# Patient Record
Sex: Female | Born: 1995 | Race: Black or African American | Hispanic: No | Marital: Single | Smoking: Never smoker
Health system: Southern US, Community
[De-identification: ages and names within clinical notes are randomized; demographics above are authoritative.]

## PROBLEM LIST (undated history)

## (undated) DIAGNOSIS — D649 Anemia, unspecified: Secondary | ICD-10-CM

## (undated) HISTORY — PX: NO PAST SURGERIES: SHX2092

---
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
## 2017-01-31 ENCOUNTER — Emergency Department: Payer: No Typology Code available for payment source

## 2017-01-31 ENCOUNTER — Emergency Department
Admission: EM | Admit: 2017-01-31 | Discharge: 2017-01-31 | Disposition: A | Discharge status: Home / Self Care | Payer: No Typology Code available for payment source | Attending: Emergency Medicine | Admitting: Emergency Medicine

## 2017-01-31 ENCOUNTER — Encounter: Payer: Self-pay | Admitting: Emergency Medicine

## 2017-01-31 ENCOUNTER — Other Ambulatory Visit: Payer: Self-pay

## 2017-01-31 DIAGNOSIS — M545 Low back pain: Secondary | ICD-10-CM | POA: Diagnosis not present

## 2017-01-31 DIAGNOSIS — R51 Headache: Secondary | ICD-10-CM | POA: Insufficient documentation

## 2017-01-31 HISTORY — DX: Anemia, unspecified: D64.9

## 2017-01-31 MED ORDER — MELOXICAM 7.5 MG PO TABS
7.5000 mg | ORAL_TABLET | Freq: Every day | ORAL | 1 refills | Status: AC
Start: 2017-01-31 — End: 2017-02-07

## 2017-01-31 MED ORDER — CYCLOBENZAPRINE HCL 5 MG PO TABS: 5.0000 mg | ORAL_TABLET | Freq: Three times a day (TID) | ORAL | 0 refills | Status: AC | PRN

## 2017-01-31 MED ORDER — KETOROLAC TROMETHAMINE 30 MG/ML IJ SOLN
30.0000 mg | Freq: Once | INTRAMUSCULAR | Status: AC
  Administered 2017-01-31: 30 mg via INTRAMUSCULAR
  Filled 2017-01-31: qty 1
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
## 2017-03-30 ENCOUNTER — Encounter: Payer: Self-pay | Admitting: Emergency Medicine

## 2017-03-30 ENCOUNTER — Emergency Department
Admission: EM | Admit: 2017-03-30 | Discharge: 2017-03-30 | Disposition: A | Discharge status: Home / Self Care | Payer: Self-pay | Attending: Emergency Medicine | Admitting: Emergency Medicine

## 2017-03-30 ENCOUNTER — Other Ambulatory Visit: Payer: Self-pay

## 2017-03-30 ENCOUNTER — Emergency Department: Payer: Self-pay

## 2017-03-30 DIAGNOSIS — F17290 Nicotine dependence, other tobacco product, uncomplicated: Secondary | ICD-10-CM | POA: Insufficient documentation

## 2017-03-30 DIAGNOSIS — R0981 Nasal congestion: Secondary | ICD-10-CM | POA: Insufficient documentation

## 2017-03-30 DIAGNOSIS — J069 Acute upper respiratory infection, unspecified: Secondary | ICD-10-CM | POA: Insufficient documentation

## 2017-03-30 DIAGNOSIS — J3489 Other specified disorders of nose and nasal sinuses: Secondary | ICD-10-CM | POA: Insufficient documentation

## 2017-03-30 DIAGNOSIS — R531 Weakness: Secondary | ICD-10-CM | POA: Insufficient documentation

## 2017-03-30 LAB — CBC WITH DIFFERENTIAL/PLATELET
BASOS ABS: 0 10*3/uL (ref 0–0.1)
BASOS PCT: 0 %
EOS ABS: 0.1 10*3/uL (ref 0–0.7)
Eosinophils Relative: 1 %
HEMATOCRIT: 43 % (ref 35.0–47.0)
Hemoglobin: 14.8 g/dL (ref 12.0–16.0)
Lymphocytes Relative: 30 %
Lymphs Abs: 1.8 10*3/uL (ref 1.0–3.6)
MCH: 32.1 pg (ref 26.0–34.0)
MCHC: 34.3 g/dL (ref 32.0–36.0)
MCV: 93.7 fL (ref 80.0–100.0)
MONO ABS: 1.1 10*3/uL — AB (ref 0.2–0.9)
Monocytes Relative: 19 %
NEUTROS ABS: 2.9 10*3/uL (ref 1.4–6.5)
Neutrophils Relative %: 50 %
Platelets: 193 10*3/uL (ref 150–440)
RBC: 4.59 MIL/uL (ref 3.80–5.20)
RDW: 13 % (ref 11.5–14.5)
WBC: 5.8 10*3/uL (ref 3.6–11.0)

## 2017-03-30 LAB — COMPREHENSIVE METABOLIC PANEL
ALT: 9 U/L — ABNORMAL LOW (ref 14–54)
AST: 21 U/L (ref 15–41)
Albumin: 4.1 g/dL (ref 3.5–5.0)
Alkaline Phosphatase: 42 U/L (ref 38–126)
Anion gap: 10 (ref 5–15)
BUN: 6 mg/dL (ref 6–20)
CO2: 20 mmol/L — ABNORMAL LOW (ref 22–32)
Calcium: 9.4 mg/dL (ref 8.9–10.3)
Chloride: 108 mmol/L (ref 101–111)
Creatinine, Ser: 0.64 mg/dL (ref 0.44–1.00)
GFR calc Af Amer: >60 mL/min (ref >60)
GFR calc non Af Amer: >60 mL/min (ref >60)
Glucose, Bld: 81 mg/dL (ref 65–99)
Potassium: 4 mmol/L (ref 3.5–5.1)
Sodium: 138 mmol/L (ref 135–145)
Total Bilirubin: 0.6 mg/dL (ref 0.3–1.2)
Total Protein: 7.7 g/dL (ref 6.5–8.1)

## 2017-03-30 NOTE — ED Triage Notes (Signed)
FIRST NURSE NOTE-feels SHOB. Unlabored, ambulatory without distress. No fever.

## 2017-03-30 NOTE — ED Provider Notes (Signed)
[facility] Emergency Department Provider Note  ____________________________________________  Time seen: Approximately 8:50 PM  I have reviewed the triage vital signs and the nursing notes.   HISTORY  Chief Complaint Shortness of Breath    HPI Melissa Thompson is a 22 y.o. female presents to the emergency department with complaints of intermittent shortness of breath for the past 2 weeks.She secondarily reports feeling "weak" for the past two days.   Patient reports that shortness of breath is constant and she feels "sometimes jittery" after she smokes her cigars in the morning time. Patient denies cough but has had some rhinorrhea and congestion.  No fever.  No recent travel, history of DVT, prolonged immobility or recent surgery.  She denies chest pain, chest tightness, nausea, vomiting abdominal pain.  Patient is speaking in complete sentences.   Past Medical History:  Diagnosis Date  . Anemia     There are no active problems to display for this patient.   History reviewed. No pertinent surgical history.  Prior to Admission medications   Not on File    Allergies Patient has no known allergies.  History reviewed. No pertinent family history.  Social History Social History   Tobacco Use  . Smoking status: Never Smoker  . Smokeless tobacco: Never Used  . Tobacco comment: black and milds  Substance Use Topics  . Alcohol use: Yes    Comment: twice a week  . Drug use: No     Review of Systems  Constitutional: No fever/chills Eyes: No visual changes. No discharge ENT: No upper respiratory complaints. Cardiovascular: no chest pain. Respiratory: Patient has SOB.  Gastrointestinal: No abdominal pain.  No nausea, no vomiting.  No diarrhea.  No constipation. Genitourinary: Negative for dysuria. No hematuria Musculoskeletal: Negative for musculoskeletal pain. Skin: Negative for rash, abrasions, lacerations, ecchymosis. Neurological: Negative  for headaches, focal weakness or numbness.   ____________________________________________   PHYSICAL EXAM:  VITAL SIGNS: ED Triage Vitals  Enc Vitals Group     BP 03/30/17 1806 137/79     Pulse Rate 03/30/17 1806 69     Resp 03/30/17 1806 18     Temp 03/30/17 1806 98.9 F (37.2 C)     Temp Source 03/30/17 1806 Oral     SpO2 03/30/17 1806 99 %     Weight 03/30/17 1806 181 lb (82.1 kg)     Height 03/30/17 1806 5\' 5"  (1.651 m)     Head Circumference --      Peak Flow --      Pain Score 03/30/17 1759 0     Pain Loc --      Pain Edu? --      Excl. in GC? --      Constitutional: Alert and oriented. Well appearing and in no acute distress. Eyes: Conjunctivae are normal. PERRL. EOMI. Head: Atraumatic. ENT:      Ears: TMs are pearly bilaterally.      Nose: No congestion/rhinnorhea.      Mouth/Throat: Mucous membranes are moist.  Neck: No stridor.  No cervical spine tenderness to palpation. Hematological/Lymphatic/Immunilogical: No cervical lymphadenopathy. Cardiovascular: Normal rate, regular rhythm. Normal S1 and S2.  Good peripheral circulation. Respiratory: Normal respiratory effort without tachypnea or retractions. Lungs CTAB. Good air entry to the bases with no decreased or absent breath sounds. Gastrointestinal: Bowel sounds 4 quadrants. Soft and nontender to palpation. No guarding or rigidity. No palpable masses. No distention. No CVA tenderness. Musculoskeletal: Full range of motion to all extremities. No  gross deformities appreciated. Neurologic:  Normal speech and language. No gross focal neurologic deficits are appreciated.  Skin:  Skin is warm, dry and intact. No rash noted. Psychiatric: Mood and affect are normal. Speech and behavior are normal. Patient exhibits appropriate insight and judgement.   ____________________________________________   LABS (all labs ordered are listed, but only abnormal results are displayed)  Labs Reviewed  COMPREHENSIVE METABOLIC  PANEL - Abnormal; Notable for the following components:      Result Value   CO2 20 (*)    ALT 9 (*)    All other components within normal limits  CBC WITH DIFFERENTIAL/PLATELET - Abnormal; Notable for the following components:   Monocytes Absolute 1.1 (*)    All other components within normal limits  CBC WITH DIFFERENTIAL/PLATELET   ____________________________________________  EKG   ____________________________________________  RADIOLOGY Geraldo Pitter, personally viewed and evaluated these images (plain radiographs) as part of my medical decision making, as well as reviewing the written report by the radiologist.  Dg Chest 2 View  Result Date: 03/30/2017 CLINICAL DATA:  Patient with shortness of breath. EXAM: CHEST  2 VIEW COMPARISON:  None. FINDINGS: Normal cardiac and mediastinal contours. No consolidative pulmonary opacities. No pleural effusion or pneumothorax. Regional skeleton is unremarkable. IMPRESSION: No active cardiopulmonary disease. Electronically Signed   By: Annia Belt M.D.   On: 03/30/2017 18:54    ____________________________________________    PROCEDURES  Procedure(s) performed:    Procedures    Medications - No data to display   ____________________________________________   INITIAL IMPRESSION / ASSESSMENT AND PLAN / ED COURSE  Pertinent labs & imaging results that were available during my care of the patient were reviewed by me and considered in my medical decision making (see chart for details).  Review of the [place] CSRS was performed in accordance of the [facility] prior to dispensing any controlled drugs.     Assessment and plan Viral upper respiratory tract infection Patient presents to the emergency department with self perceived shortness of breath for the past 2 weeks.  Associated symptoms included congestion and rhinorrhea.  Differential diagnosis originally included upper respiratory tract infection, community-acquired pneumonia,  pulmonary embolism and anxiety.  DG chest revealed no consolidations or findings consistent with pneumonia.  Patient is at low risk clinically for pulmonary embolism given absence of recent travel, recent surgery, prolonged immobility or history of DVT or PE.  History and physical exam findings are most consistent with a viral upper respiratory tract infection.  CBC and CMP were reassuring.  Supportive measures were encouraged.  Patient was advised to follow-up with primary care as needed.  ____________________________________________  FINAL CLINICAL IMPRESSION(S) / ED DIAGNOSES  Final diagnoses:  Viral URI      NEW MEDICATIONS STARTED DURING THIS VISIT:  ED Discharge Orders    None          This chart was dictated using voice recognition software/Dragon. Despite best efforts to proofread, errors can occur which can change the meaning. Any change was purely unintentional.    Orvil Feil, PA-C 03/30/17 2317    Pia Mau [place], PA-C 03/30/17 Weyman Croon    Jene Every, MD 03/30/17 2326

## 2017-03-30 NOTE — ED Triage Notes (Signed)
Pt states she has had shortness of breath for 2 weeks and states when she yawns it is not a "full yawn"  Pt is not having any respiratory distress at this time, pt ambulatory without shortness of breath and is able to speak in complete sentences without getting short of breath.

## 2017-05-03 ENCOUNTER — Other Ambulatory Visit: Payer: Self-pay

## 2017-05-03 ENCOUNTER — Emergency Department
Admission: EM | Admit: 2017-05-03 | Discharge: 2017-05-03 | Disposition: A | Discharge status: Home / Self Care | Payer: Medicaid Other | Attending: Emergency Medicine | Admitting: Emergency Medicine

## 2017-05-03 ENCOUNTER — Encounter: Payer: Self-pay | Admitting: Emergency Medicine

## 2017-05-03 ENCOUNTER — Emergency Department
Admission: EM | Admit: 2017-05-03 | Discharge: 2017-05-04 | Disposition: A | Discharge status: Home / Self Care | Payer: Self-pay | Attending: Emergency Medicine | Admitting: Emergency Medicine

## 2017-05-03 ENCOUNTER — Emergency Department: Payer: Self-pay

## 2017-05-03 DIAGNOSIS — O200 Threatened abortion: Secondary | ICD-10-CM | POA: Insufficient documentation

## 2017-05-03 DIAGNOSIS — O9989 Other specified diseases and conditions complicating pregnancy, childbirth and the puerperium: Secondary | ICD-10-CM | POA: Insufficient documentation

## 2017-05-03 DIAGNOSIS — N939 Abnormal uterine and vaginal bleeding, unspecified: Secondary | ICD-10-CM | POA: Insufficient documentation

## 2017-05-03 DIAGNOSIS — R109 Unspecified abdominal pain: Secondary | ICD-10-CM | POA: Insufficient documentation

## 2017-05-03 DIAGNOSIS — Z3A00 Weeks of gestation of pregnancy not specified: Secondary | ICD-10-CM | POA: Insufficient documentation

## 2017-05-03 DIAGNOSIS — Z5321 Procedure and treatment not carried out due to patient leaving prior to being seen by health care provider: Secondary | ICD-10-CM | POA: Insufficient documentation

## 2017-05-03 LAB — CBC
HEMATOCRIT: 39.1 % (ref 35.0–47.0)
Hemoglobin: 13.4 g/dL (ref 12.0–16.0)
MCH: 32.1 pg (ref 26.0–34.0)
MCHC: 34.2 g/dL (ref 32.0–36.0)
MCV: 94 fL (ref 80.0–100.0)
Platelets: 202 10*3/uL (ref 150–440)
RBC: 4.16 MIL/uL (ref 3.80–5.20)
RDW: 13 % (ref 11.5–14.5)
WBC: 7.4 10*3/uL (ref 3.6–11.0)

## 2017-05-03 LAB — URINALYSIS, COMPLETE (UACMP) WITH MICROSCOPIC
BACTERIA UA: NONE SEEN
BILIRUBIN URINE: NEGATIVE
Glucose, UA: NEGATIVE mg/dL
KETONES UR: NEGATIVE mg/dL
LEUKOCYTES UA: NEGATIVE
Nitrite: NEGATIVE
PROTEIN: NEGATIVE mg/dL
SPECIFIC GRAVITY, URINE: 1.013 (ref 1.005–1.030)
pH: 7 (ref 5.0–8.0)

## 2017-05-03 LAB — COMPREHENSIVE METABOLIC PANEL
ALBUMIN: 4.2 g/dL (ref 3.5–5.0)
ALT: 7 U/L — ABNORMAL LOW (ref 14–54)
AST: 18 U/L (ref 15–41)
Alkaline Phosphatase: 38 U/L (ref 38–126)
Anion gap: 7 (ref 5–15)
BUN: 9 mg/dL (ref 6–20)
CHLORIDE: 108 mmol/L (ref 101–111)
CO2: 24 mmol/L (ref 22–32)
Calcium: 9.3 mg/dL (ref 8.9–10.3)
Creatinine, Ser: 0.76 mg/dL (ref 0.44–1.00)
GFR calc Af Amer: >60 mL/min (ref >60)
GLUCOSE: 88 mg/dL (ref 65–99)
POTASSIUM: 4 mmol/L (ref 3.5–5.1)
SODIUM: 139 mmol/L (ref 135–145)
Total Bilirubin: 0.6 mg/dL (ref 0.3–1.2)
Total Protein: 7.8 g/dL (ref 6.5–8.1)

## 2017-05-03 LAB — POCT PREGNANCY, URINE: PREG TEST UR: POSITIVE — AB

## 2017-05-03 LAB — ABO/RH: ABO/RH(D): O POS

## 2017-05-03 LAB — HCG, QUANTITATIVE, PREGNANCY: HCG, BETA CHAIN, QUANT, S: 2060 m[IU]/mL — AB (ref <5)

## 2017-05-03 LAB — LIPASE, BLOOD: LIPASE: 22 U/L (ref 11–51)

## 2017-05-03 NOTE — ED Provider Notes (Signed)
[facility] Emergency Department Provider Note  Time seen: 10:52 PM  I have reviewed the triage vital signs and the nursing notes.   HISTORY  Chief Complaint Abdominal Pain and Vaginal Discharge    HPI Melissa Thompson is a 22 y.o. female G1 P0 who presents to the emergency department for vaginal bleeding and a positive pregnancy test.  According to the patient she missed her period in January, last period was sometime in December but does not remember exactly when.  States she came here earlier today but due to a significant weight she left without being seen, went home and took a pregnancy test at home which was positive so she is coming back to the emergency department for evaluation.  Patient states mild abdominal cramping, states she was bleeding last night less than a normal menstrual period.  Today she is just spotting.  Denies any "pain."  Denies vaginal discharge.  Largely negative review of systems.   Past Medical History:  Diagnosis Date  . Anemia     There are no active problems to display for this patient.   History reviewed. No pertinent surgical history.  Prior to Admission medications   Not on File    No Known Allergies  No family history on file.  Social History Social History   Tobacco Use  . Smoking status: Never Smoker  . Smokeless tobacco: Never Used  . Tobacco comment: black and milds  Substance Use Topics  . Alcohol use: Yes    Comment: twice a week  . Drug use: No    Review of Systems Constitutional: Negative for fever. Eyes: Negative for visual complaints ENT: Negative for recent illness/congestion Cardiovascular: Negative for chest pain. Respiratory: Negative for shortness of breath. Gastrointestinal: Lower abdominal cramping.  Gone currently.  Negative for nausea vomiting or diarrhea Genitourinary: Positive for vaginal bleeding/spotting Musculoskeletal: Negative for musculoskeletal complaints Skin: Negative for  skin complaints  Neurological: Negative for headache All other ROS negative  ____________________________________________   PHYSICAL EXAM:  VITAL SIGNS: ED Triage Vitals [05/03/17 2158]  Enc Vitals Group     BP 116/78     Pulse Rate 82     Resp (!) 2     Temp 98 F (36.7 C)     Temp Source Oral     SpO2 100 %     Weight 184 lb (83.5 kg)     Height 5\' 5"  (1.651 m)     Head Circumference      Peak Flow      Pain Score 0     Pain Loc      Pain Edu?      Excl. in GC?    Constitutional: Alert and oriented. Well appearing and in no distress. Eyes: Normal exam ENT   Head: Normocephalic and atraumatic.   Mouth/Throat: Mucous membranes are moist. Cardiovascular: Normal rate, regular rhythm. No murmur Respiratory: Normal respiratory effort without tachypnea nor retractions. Breath sounds are clear and equal bilaterally. No wheezes/rales/rhonchi. Gastrointestinal: Soft and nontender. No distention.   Musculoskeletal: Nontender with normal range of motion in all extremities.  Neurologic:  Normal speech and language. No gross focal neurologic deficits Skin:  Skin is warm, dry and intact.  Psychiatric: Mood and affect are normal.   ____________________________________________   RADIOLOGY  Ultrasound pending  ____________________________________________   INITIAL IMPRESSION / ASSESSMENT AND PLAN / ED COURSE  Pertinent labs & imaging results that were available during my care of the patient were reviewed by  me and considered in my medical decision making (see chart for details).  Patient presents the emergency department for vaginal bleeding and a positive pregnancy test.  Patient was seen earlier in the emergency department, had labs showing a beta hCG of 2000 otherwise normal labs.  Patient denies any vaginal discharge or other concerning symptoms.  States she was having lower abdominal cramping but that has since resolved.  States she is currently only having vaginal  spotting.  We will proceed with an ultrasound to evaluate.  Will obtain an ABo/rh  which was not collected earlier as well as a repeat beta-hCG level.  Patient agreeable to this plan of care.  Overall the patient appears very well, no distress, calm, comfortable and cooperative.  Ultrasound, ABO/Rh and repeat quantitative pregnancy level pending. Patient care signed out to Dr. Dolores FrameSung.  ____________________________________________   FINAL CLINICAL IMPRESSION(S) / ED DIAGNOSES  Threatened miscarriage    Melissa Thompson, Melissa Jacome, MD 05/03/17 2258

## 2017-05-03 NOTE — ED Triage Notes (Signed)
Pt reports she has lower abdominal pain and had vaginal bleeding, reports she had a pregnancy test and was positive "I think I had a miscarriage" reports she was in ER earlier and left. Pt is calm no distress noted in triage

## 2017-05-03 NOTE — ED Triage Notes (Signed)
Pt came to ED via pov c/o vaginal bleeding. Pt reports she believes she may be having a miscarriage. Reports never took a pregnancy test, last period in December. Reports bleeding is heavier than a normal period. Pt is having lower abdominal pain.

## 2017-05-03 NOTE — ED Notes (Signed)
Brought back to see MD  Placed in room  The wait was explained  And she states she did not want to wait

## 2017-05-03 NOTE — ED Notes (Signed)
Patient transported to Ultrasound 

## 2017-05-04 LAB — HCG, QUANTITATIVE, PREGNANCY: hCG, Beta Chain, Quant, S: 1368 m[IU]/mL — ABNORMAL HIGH (ref <5)

## 2017-05-04 NOTE — Discharge Instructions (Signed)
1.  Avoid tampons, douche or sexual intercourse until seen by the specialist. 2.  Drink plenty of fluids and rest this weekend. 3.  Return to the ER for worsening symptoms, soaking more than 1 maxi pad per hour, feeling faint or other concerns.

## 2017-05-04 NOTE — ED Notes (Signed)
Reviewed discharge instructions, follow-up care, and outpatient lab draw with patient. Patient verbalized understanding of all information reviewed. Patient stable, with no distress noted at this time.

## 2017-05-04 NOTE — ED Provider Notes (Signed)
-----------------------------------------   12:17 AM on 05/04/2017 -----------------------------------------  Ultrasound interpreted by Dr. Mayford KnifeWilliams: 1. No intrauterine pregnancy identified. The lack of an IUP in the  setting of a positive pregnancy test could represent ectopic  pregnancy, early pregnancy, or recent miscarriage. Recommend close  follow-up.    Updated patient of ultrasound and blood results.  Decreasing beta hCG.  Patient states majority of her heavy bleeding and cramping have resolved.  Most likely a spontaneous miscarriage but encouraged her to follow-up with OB/GYN for repeat ultrasound and beta hCG to ensure completion of miscarriage.  Strict return precautions given.  Pelvic rest precautions given.  Patient verbalizes understanding and agrees with plan of care.  She was given an outpatient laboratory form to return Monday morning to recheck her beta hCG.   Irean HongSung, Karrington Mccravy J, MD 05/04/17 [phone]

## 2018-01-02 ENCOUNTER — Emergency Department
Admission: EM | Admit: 2018-01-02 | Discharge: 2018-01-02 | Disposition: A | Discharge status: Home / Self Care | Payer: Medicaid Other | Attending: Emergency Medicine | Admitting: Emergency Medicine

## 2018-01-02 ENCOUNTER — Other Ambulatory Visit: Payer: Self-pay

## 2018-01-02 ENCOUNTER — Encounter: Payer: Self-pay | Admitting: Emergency Medicine

## 2018-01-02 DIAGNOSIS — Z3A01 Less than 8 weeks gestation of pregnancy: Secondary | ICD-10-CM | POA: Diagnosis not present

## 2018-01-02 DIAGNOSIS — Z3201 Encounter for pregnancy test, result positive: Secondary | ICD-10-CM | POA: Diagnosis not present

## 2018-01-02 LAB — URINALYSIS, COMPLETE (UACMP) WITH MICROSCOPIC
BACTERIA UA: NONE SEEN
BILIRUBIN URINE: NEGATIVE
Glucose, UA: NEGATIVE mg/dL
HGB URINE DIPSTICK: NEGATIVE
KETONES UR: 20 mg/dL — AB
LEUKOCYTES UA: NEGATIVE
NITRITE: NEGATIVE
Protein, ur: NEGATIVE mg/dL
Specific Gravity, Urine: 1.008 (ref 1.005–1.030)
pH: 6 (ref 5.0–8.0)

## 2018-01-02 LAB — POCT PREGNANCY, URINE: PREG TEST UR: POSITIVE — AB

## 2018-01-02 NOTE — ED Notes (Signed)
See triage note  States she is here for pregnancy test   States she took 6 home tests and all but one was positive  Denies any vaginal bleeding or pain

## 2018-01-02 NOTE — ED Notes (Signed)
Pt ambulatory to POV without difficulty. VSS. NAD. Discharge instructions, RX and follow up reviewed. All questions and concerns addressed.  

## 2018-01-02 NOTE — ED Triage Notes (Signed)
Pt to ED via POV states she has taken 7 preg test at home and all positive expect 1. Pt wants to know if she is preg. Unknown LMP. NAD noted

## 2018-01-02 NOTE — ED Provider Notes (Signed)
[facility] Emergency Department Provider Note  ___________________________________________   First MD Initiated Contact with Patient 01/02/18 1203     (approximate)  I have reviewed the triage vital signs and the nursing notes.   HISTORY  Chief Complaint No chief complaint on file.    HPI Melissa Thompson is a 22 y.o. female Modena Jansky to the ED requesting a pregnancy test.  Patient states that she took 7 pregnancy test was 6 of them being positive at home.  She states that she has used birth control but admits that she did not show up on time for her last Depo shot and missed the day she was supposed to be there by 2 weeks.  She also states that the first of the year she also got pregnant while using Depo but admits she is not very compliant.  Currently she denies any vaginal bleeding cramping or pain.  She simply wanted to know if she is pregnant.   Past Medical History:  Diagnosis Date  . Anemia     There are no active problems to display for this patient.   History reviewed. No pertinent surgical history.  Prior to Admission medications   Not on File    Allergies Patient has no known allergies.  No family history on file.  Social History Social History   Tobacco Use  . Smoking status: Never Smoker  . Smokeless tobacco: Never Used  . Tobacco comment: black and milds  Substance Use Topics  . Alcohol use: Yes    Comment: twice a week  . Drug use: No    Review of Systems Constitutional: No fever/chills Cardiovascular: Denies chest pain. Respiratory: Denies shortness of breath. Gastrointestinal: No abdominal pain.  No nausea, no vomiting.  No diarrhea.  No constipation. Genitourinary: Negative for dysuria.  No vaginal discharge or bleeding. Musculoskeletal: Negative for back pain. Skin: Negative for rash. Neurological: Negative for headaches, focal weakness or numbness. ___________________________________________   PHYSICAL  EXAM:  VITAL SIGNS: ED Triage Vitals [01/02/18 1143]  Enc Vitals Group     BP 116/63     Pulse Rate 83     Resp 16     Temp 98.4 F (36.9 C)     Temp Source Oral     SpO2 96 %     Weight 190 lb (86.2 kg)     Height 5\' 4"  (1.626 m)     Head Circumference      Peak Flow      Pain Score 0     Pain Loc      Pain Edu?      Excl. in GC?    Constitutional: Alert and oriented. Well appearing and in no acute distress.  Patient is very pleasant and appropriate. Eyes: Conjunctivae are normal.  Head: Atraumatic. Nose: No congestion/rhinnorhea. Neck: No stridor.   Cardiovascular: Normal rate, regular rhythm. Grossly normal heart sounds.  Good peripheral circulation. Respiratory: Normal respiratory effort.  No retractions. Lungs CTAB. Gastrointestinal: Soft and nontender. No distention.  No CVA tenderness. Musculoskeletal: Moves upper and lower extremities without any difficulty.  Normal gait was noted. Neurologic:  Normal speech and language. No gross focal neurologic deficits are appreciated. No gait instability. Skin:  Skin is warm, dry and intact. No rash noted. Psychiatric: Mood and affect are normal. Speech and behavior are normal.  ____________________________________________   LABS (all labs ordered are listed, but only abnormal results are displayed)  Labs Reviewed  URINALYSIS, COMPLETE (UACMP) WITH MICROSCOPIC -  Abnormal; Notable for the following components:      Result Value   Color, Urine YELLOW (*)    APPearance CLEAR (*)    Ketones, ur 20 (*)    All other components within normal limits  POCT PREGNANCY, URINE - Abnormal; Notable for the following components:   Preg Test, Ur POSITIVE (*)    All other components within normal limits  POC URINE PREG, ED    PROCEDURES  Procedure(s) performed: None  Procedures  Critical Care performed: No  ____________________________________________   INITIAL IMPRESSION / ASSESSMENT AND PLAN / ED COURSE  As part of my  medical decision making, I reviewed the following data within the electronic MEDICAL RECORD NUMBER Notes from prior ED visits and [place] Controlled Substance Database  Patient presents to the ED requesting a pregnancy test.  Patient states that she took some) C test at home and all were positive with the exception of one.  She states that she has been using Depo shots for birth control but admits that she was 2 weeks late in getting her last injection.  She denies any vaginal pain, cramping or bleeding.  She plans to follow-up with [facility] and also the health department for prenatal care.  ____________________________________________   FINAL CLINICAL IMPRESSION(S) / ED DIAGNOSES  Final diagnoses:  Less than [redacted] weeks gestation of pregnancy     ED Discharge Orders    None       Note:  This document was prepared using Dragon voice recognition software and may include unintentional dictation errors.    Tommi Rumps, PA-C 01/02/18 1234    Sharman Cheek, MD 01/02/18 [phone]

## 2018-01-02 NOTE — Discharge Instructions (Addendum)
Follow-up with your primary care doctor at [facility] clinic and also make an appointment with the health department for their prenatal clinic.  Increase fluids.  Do not take any anti-inflammatory such as ibuprofen, Aleve or Advil. Take medications only as directed by your PCP.

## 2018-01-16 ENCOUNTER — Other Ambulatory Visit: Payer: Self-pay | Admitting: Family Medicine

## 2018-01-16 DIAGNOSIS — Z3481 Encounter for supervision of other normal pregnancy, first trimester: Secondary | ICD-10-CM

## 2018-01-21 ENCOUNTER — Ambulatory Visit
Admission: RE | Admit: 2018-01-21 | Discharge: 2018-01-21 | Disposition: A | Discharge status: Home / Self Care | Payer: Medicaid Other | Source: Ambulatory Visit | Attending: Family Medicine | Admitting: Family Medicine

## 2018-11-14 ENCOUNTER — Other Ambulatory Visit: Payer: Self-pay

## 2018-11-14 ENCOUNTER — Emergency Department (HOSPITAL_COMMUNITY)
Admission: EM | Admit: 2018-11-14 | Discharge: 2018-11-14 | Disposition: A | Discharge status: Home / Self Care | Payer: Medicaid Other | Attending: Emergency Medicine | Admitting: Emergency Medicine

## 2018-11-14 ENCOUNTER — Emergency Department (HOSPITAL_COMMUNITY): Payer: Medicaid Other

## 2018-11-14 ENCOUNTER — Encounter (HOSPITAL_COMMUNITY): Payer: Self-pay | Admitting: Emergency Medicine

## 2018-11-14 DIAGNOSIS — N898 Other specified noninflammatory disorders of vagina: Secondary | ICD-10-CM | POA: Diagnosis not present

## 2018-11-14 DIAGNOSIS — O23592 Infection of other part of genital tract in pregnancy, second trimester: Secondary | ICD-10-CM | POA: Diagnosis not present

## 2018-11-14 DIAGNOSIS — O9989 Other specified diseases and conditions complicating pregnancy, childbirth and the puerperium: Secondary | ICD-10-CM | POA: Diagnosis present

## 2018-11-14 DIAGNOSIS — R102 Pelvic and perineal pain: Secondary | ICD-10-CM

## 2018-11-14 DIAGNOSIS — R55 Syncope and collapse: Secondary | ICD-10-CM | POA: Insufficient documentation

## 2018-11-14 DIAGNOSIS — B373 Candidiasis of vulva and vagina: Secondary | ICD-10-CM | POA: Diagnosis not present

## 2018-11-14 DIAGNOSIS — Z3A18 18 weeks gestation of pregnancy: Secondary | ICD-10-CM | POA: Insufficient documentation

## 2018-11-14 DIAGNOSIS — B3731 Acute candidiasis of vulva and vagina: Secondary | ICD-10-CM

## 2018-11-14 LAB — WET PREP, GENITAL
Clue Cells Wet Prep HPF POC: NONE SEEN
Sperm: NONE SEEN
Trich, Wet Prep: NONE SEEN

## 2018-11-14 LAB — URINALYSIS, ROUTINE W REFLEX MICROSCOPIC
Bilirubin Urine: NEGATIVE
Glucose, UA: NEGATIVE mg/dL
Hgb urine dipstick: NEGATIVE
Ketones, ur: NEGATIVE mg/dL
Leukocytes,Ua: NEGATIVE
Nitrite: NEGATIVE
Protein, ur: NEGATIVE mg/dL
Specific Gravity, Urine: 1.009 (ref 1.005–1.030)
pH: 8 (ref 5.0–8.0)

## 2018-11-14 LAB — HCG, QUANTITATIVE, PREGNANCY: hCG, Beta Chain, Quant, S: 30615 m[IU]/mL — ABNORMAL HIGH (ref <5)

## 2018-11-14 LAB — CBC
HCT: 38.5 % (ref 36.0–46.0)
Hemoglobin: 12.8 g/dL (ref 12.0–15.0)
MCH: 32.6 pg (ref 26.0–34.0)
MCHC: 33.2 g/dL (ref 30.0–36.0)
MCV: 98 fL (ref 80.0–100.0)
Platelets: 228 10*3/uL (ref 150–400)
RBC: 3.93 MIL/uL (ref 3.87–5.11)
RDW: 13.5 % (ref 11.5–15.5)
WBC: 15.6 10*3/uL — ABNORMAL HIGH (ref 4.0–10.5)
nRBC: 0 % (ref 0.0–0.2)

## 2018-11-14 LAB — BASIC METABOLIC PANEL
Anion gap: 9 (ref 5–15)
BUN: <5 mg/dL — ABNORMAL LOW (ref 6–20)
CO2: 22 mmol/L (ref 22–32)
Calcium: 9.1 mg/dL (ref 8.9–10.3)
Chloride: 106 mmol/L (ref 98–111)
Creatinine, Ser: 0.57 mg/dL (ref 0.44–1.00)
GFR calc Af Amer: >60 mL/min (ref >60)
GFR calc non Af Amer: >60 mL/min (ref >60)
Glucose, Bld: 84 mg/dL (ref 70–99)
Potassium: 3.7 mmol/L (ref 3.5–5.1)
Sodium: 137 mmol/L (ref 135–145)

## 2018-11-14 LAB — CBG MONITORING, ED: Glucose-Capillary: 73 mg/dL (ref 70–99)

## 2018-11-14 MED ORDER — CLOTRIMAZOLE 1 % VA CREA: 1.0000 | TOPICAL_CREAM | Freq: Every day | VAGINAL | 0 refills | Status: AC

## 2018-11-14 MED ORDER — SODIUM CHLORIDE 0.9 % IV BOLUS
1000.0000 mL | Freq: Once | INTRAVENOUS | Status: AC
  Administered 2018-11-14: 1000 mL via INTRAVENOUS

## 2018-11-14 MED ORDER — SODIUM CHLORIDE 0.9% FLUSH: 3.0000 mL | Freq: Once | INTRAVENOUS | Status: DC

## 2018-11-14 MED ORDER — PRENATAL MULTIVITAMIN CH: 1.0000 | ORAL_TABLET | Freq: Every day | ORAL | 0 refills | Status: AC

## 2018-11-14 NOTE — ED Triage Notes (Signed)
Pt states she has had a syncopal episode x 2 since being pregnant (LMP: end of June). Pt denies injury to body r/t syncope. Pt c/o dizziness.

## 2018-11-14 NOTE — ED Provider Notes (Signed)
[facility]-EMERGENCY DEPT Provider Note   CSN: [id number] Arrival date & time: 11/14/18  [id number]     History   Chief Complaint Chief Complaint  Patient presents with  . Loss of Consciousness     HPI Jeralyn Ruthsancy L Pagett is a 23 y.o. female.     The history is provided by the patient. No language interpreter was used.  Loss of Consciousness    23 year old female with history of anemia who is currently pregnant presenting for a syncopal episode.  Patient report her last menstrual period was approximately 3 months ago.  She did test positive for pregnancy.  She has had 1 OB/GYN visit but has not had a formal ultrasound yet.  She mention having 2 separate episodes of syncope.  First episode was 6 days ago while she was standing for prolonged period of time folding clothes felt lightheadedness and found herself on the ground.  She did not injure herself and did not seek help.  Today, while standing in the kitchen doing chores she once again had a syncopal episode that was witnessed in which she found her self on the ground again.  Thus prompting her ER visit.  She denies any significant injury.  No headache, chest pain, trouble breathing, focal numbness weakness or abdominal pain.  Aside from functional vaginal discharge she denies any vaginal bleeding.  She is a G3, P0.  Has history of anemia without prior blood transfusion.  Endorsed mild morning sickness in which she avoid eating breakfast.  She denies any preceding symptoms prior to her syncope.  Past Medical History:  Diagnosis Date  . Anemia     There are no active problems to display for this patient.   History reviewed. No pertinent surgical history.   OB History    Gravida  1   Para      Term      Preterm      AB      Living        SAB      TAB      Ectopic      Multiple      Live Births               Home Medications    Prior to Admission medications   Not on File    Family  History No family history on file.  Social History Social History   Tobacco Use  . Smoking status: Never Smoker  . Smokeless tobacco: Never Used  . Tobacco comment: black and milds  Substance Use Topics  . Alcohol use: Yes    Comment: twice a week  . Drug use: No     Allergies   Patient has no known allergies.   Review of Systems Review of Systems  Cardiovascular: Positive for syncope.  All other systems reviewed and are negative.    Physical Exam Updated Vital Signs BP 118/67   Pulse 84   Temp 98.6 F (37 C) (Oral)   Resp 16   Ht 5\' 5"  (1.651 m)   Wt 77.6 kg   LMP 09/14/2018 (Approximate)   SpO2 99%   BMI 28.46 kg/m   Physical Exam Vitals signs and nursing note reviewed.  Constitutional:      General: She is not in acute distress.    Appearance: She is well-developed.  HENT:     Head: Atraumatic.  Eyes:     Conjunctiva/sclera: Conjunctivae normal.  Neck:     Musculoskeletal:  Neck supple.  Cardiovascular:     Rate and Rhythm: Normal rate and regular rhythm.     Pulses: Normal pulses.     Heart sounds: Normal heart sounds.  Pulmonary:     Effort: Pulmonary effort is normal.     Breath sounds: Normal breath sounds.  Abdominal:     General: Abdomen is flat.     Palpations: Abdomen is soft.     Tenderness: There is abdominal tenderness (Mild suprapubic tenderness without guarding or rebound tenderness).  Genitourinary:    Comments: Chaperone present during exam.  No inguinal lymphadenopathy or inguinal hernia noted.  Normal external genitalia.  Mild white vaginal discharge noted in vaginal vault.  Cervical os is closed.  On bimanual examination, no adnexal mass tenderness or cervical motion tenderness Skin:    Findings: No rash.  Neurological:     Mental Status: She is alert.      ED Treatments / Results  Labs (all labs ordered are listed, but only abnormal results are displayed) Labs Reviewed  WET PREP, GENITAL - Abnormal; Notable for the  following components:      Result Value   Yeast Wet Prep HPF POC PRESENT (*)    WBC, Wet Prep HPF POC FEW (*)    All other components within normal limits  BASIC METABOLIC PANEL - Abnormal; Notable for the following components:   BUN <5 (*)    All other components within normal limits  CBC - Abnormal; Notable for the following components:   WBC 15.6 (*)    All other components within normal limits  HCG, QUANTITATIVE, PREGNANCY - Abnormal; Notable for the following components:   hCG, Beta Chain, Quant, S 30,615 (*)    All other components within normal limits  URINALYSIS, ROUTINE W REFLEX MICROSCOPIC  RPR  HIV ANTIBODY (ROUTINE TESTING W REFLEX)  CBG MONITORING, ED  GC/CHLAMYDIA PROBE AMP ([place]) NOT AT [facility]    EKG EKG Interpretation  Date/Time:  Friday November 14 2018 11:20:24 EDT Ventricular Rate:  67 PR Interval:    QRS Duration: 77 QT Interval:  388 QTC Calculation: 410 R Axis:   36 Text Interpretation:  Sinus rhythm Normal ECG Confirmed by Carmin Muskrat [phone]) on 11/14/2018 11:35:35 AM   Radiology US Ob Limited > 14 Wks  Result Date: 11/14/2018 CLINICAL DATA:  Pregnancy, syncope EXAM: LIMITED OBSTETRIC ULTRASOUND COMPARISON:  Prior gestation obstetrical ultrasound 05/04/2017 FINDINGS: Number of Fetuses: 1 Heart Rate:  156 bpm Movement: Yes Presentation: Cephalic Previa: No Placental Location: Posterior Amniotic Fluid (Subjective): Normal BPD:  4cm 18w 2d Maternal Findings: Cervix:  Closed appearance of the cervix, length 4.7 cm. Uterus/Adnexae: No abnormality visualized. Intrauterine gestational sac: Single IMPRESSION: Single viable intrauterine gestation, estimated gestational age by BPD measurement 18 weeks, 2 days Normal fetal heart rate. Electronically Signed   By: Lovena Le M.D.   On: 11/14/2018 14:55    Procedures Procedures (including critical care time)  Medications Ordered in ED Medications  sodium chloride flush (NS) 0.9 % injection 3 mL (has no  administration in time range)  sodium chloride 0.9 % bolus 1,000 mL (1,000 mLs Intravenous New Bag/Given 11/14/18 1134)     Initial Impression / Assessment and Plan / ED Course  I have reviewed the triage vital signs and the nursing notes.  Pertinent labs & imaging results that were available during my care of the patient were reviewed by me and considered in my medical decision making (see chart for details).  BP 118/68   Pulse 63   Temp 98.6 F (37 C) (Oral)   Resp 15   Ht 5\' 5"  (1.651 m)   Wt 77.6 kg   LMP 09/14/2018 (Approximate)   SpO2 100%   BMI 28.46 kg/m    Final Clinical Impressions(s) / ED Diagnoses   Final diagnoses:  [redacted] weeks gestation of pregnancy  Syncope, unspecified syncope type  Vagina, candidiasis    ED Discharge Orders         Ordered    clotrimazole (GYNE-LOTRIMIN) 1 % vaginal cream  Daily at bedtime     11/14/18 1602    Prenatal Vit-Fe Fumarate-FA (PRENATAL MULTIVITAMIN) TABS tablet  Daily     11/14/18 1602         11:50 AM Patient reports she is currently pregnant who has had 2 separate syncopal episode at home with prolonged standing.  Last episode was this morning.  No signs of injury were noted on exam.  Last menstrual.  Was approximately 2 to 3 months ago.  She has not had a formal ultrasound therefore will obtain pelvic ultrasound.  Work-up initiated.  3:56 PM EKG without concerning arrhythmia. Normal orthostatic vital sign, normal CBG, wet prep shows presence of yeast which I will treat with clotrimazole vaginal cream.  Urine shows no signs of infection.  Quantitative hCG is 30,000.  Electrolyte panels are reassuring, elevated white count of 15.6, nonspecific.  Transvaginal ultrasound demonstrates a single viable IUP  estimated date to be 18 weeks and 2 days.  Patient will follow with her OB/GYN for further management.  Otherwise she is stable for discharge.   Fayrene Helperran, Katherine Syme, PA-C 11/14/18 1606    Gerhard MunchLockwood, Robert, MD 11/17/18 [phone]

## 2018-11-14 NOTE — Discharge Instructions (Signed)
You are 18 weeks and 2 days pregnant.  Call and follow up with OBGYN for further management of your pregnancy [place]. [address], [place], [place] [address] [phone]  You are also diagnosed with yeast infection, use clotrimazole vaginal cream daily x 1 week.  Take prenatal vitamin.  Stay hydrated.

## 2018-11-15 LAB — HIV ANTIBODY (ROUTINE TESTING W REFLEX): HIV Screen 4th Generation wRfx: NONREACTIVE

## 2018-11-15 LAB — GC/CHLAMYDIA PROBE AMP (~~LOC~~) NOT AT ARMC
Chlamydia: NEGATIVE
Neisseria Gonorrhea: NEGATIVE

## 2018-11-17 LAB — RPR: RPR Ser Ql: REACTIVE — AB

## 2018-11-17 LAB — RPR, QUANT+TP ABS (REFLEX)
Rapid Plasma Reagin, Quant: 1:1 {titer} — ABNORMAL HIGH
T Pallidum Abs: NONREACTIVE

## 2019-04-07 ENCOUNTER — Other Ambulatory Visit: Payer: Self-pay | Admitting: Family Medicine

## 2019-04-07 DIAGNOSIS — Z3483 Encounter for supervision of other normal pregnancy, third trimester: Secondary | ICD-10-CM

## 2019-04-09 ENCOUNTER — Ambulatory Visit
Admission: RE | Admit: 2019-04-09 | Discharge: 2019-04-09 | Disposition: A | Discharge status: Home / Self Care | Payer: Medicaid Other | Source: Ambulatory Visit | Attending: Family Medicine | Admitting: Family Medicine

## 2019-04-09 ENCOUNTER — Other Ambulatory Visit: Payer: Self-pay

## 2019-04-09 DIAGNOSIS — Z3483 Encounter for supervision of other normal pregnancy, third trimester: Secondary | ICD-10-CM

## 2019-04-10 ENCOUNTER — Other Ambulatory Visit: Payer: Self-pay

## 2019-04-10 ENCOUNTER — Encounter: Payer: Self-pay | Admitting: Obstetrics and Gynecology

## 2019-04-10 ENCOUNTER — Inpatient Hospital Stay
Admission: EM | Admit: 2019-04-10 | Discharge: 2019-04-12 | DRG: 806 | Disposition: A | Discharge status: Home / Self Care | Payer: Medicaid Other | Attending: Obstetrics and Gynecology | Admitting: Obstetrics and Gynecology

## 2019-04-10 DIAGNOSIS — O9081 Anemia of the puerperium: Secondary | ICD-10-CM | POA: Diagnosis not present

## 2019-04-10 DIAGNOSIS — Z20822 Contact with and (suspected) exposure to covid-19: Secondary | ICD-10-CM | POA: Diagnosis present

## 2019-04-10 DIAGNOSIS — Z8619 Personal history of other infectious and parasitic diseases: Secondary | ICD-10-CM | POA: Diagnosis present

## 2019-04-10 DIAGNOSIS — D62 Acute posthemorrhagic anemia: Secondary | ICD-10-CM | POA: Diagnosis not present

## 2019-04-10 DIAGNOSIS — O0930 Supervision of pregnancy with insufficient antenatal care, unspecified trimester: Secondary | ICD-10-CM

## 2019-04-10 DIAGNOSIS — Z3A39 39 weeks gestation of pregnancy: Secondary | ICD-10-CM

## 2019-04-10 DIAGNOSIS — O26893 Other specified pregnancy related conditions, third trimester: Secondary | ICD-10-CM | POA: Diagnosis present

## 2019-04-10 LAB — RESPIRATORY PANEL BY RT PCR (FLU A&B, COVID)
Influenza A by PCR: NEGATIVE
Influenza B by PCR: NEGATIVE
SARS Coronavirus 2 by RT PCR: NEGATIVE

## 2019-04-10 LAB — CBC
HCT: 36 % (ref 36.0–46.0)
Hemoglobin: 12.2 g/dL (ref 12.0–15.0)
MCH: 31.9 pg (ref 26.0–34.0)
MCHC: 33.9 g/dL (ref 30.0–36.0)
MCV: 94 fL (ref 80.0–100.0)
Platelets: 218 10*3/uL (ref 150–400)
RBC: 3.83 MIL/uL — ABNORMAL LOW (ref 3.87–5.11)
RDW: 13.8 % (ref 11.5–15.5)
WBC: 18.8 10*3/uL — ABNORMAL HIGH (ref 4.0–10.5)
nRBC: 0 % (ref 0.0–0.2)

## 2019-04-10 LAB — TYPE AND SCREEN
ABO/RH(D): O POS
Antibody Screen: NEGATIVE

## 2019-04-10 MED ORDER — SIMETHICONE 80 MG PO CHEW: 80.0000 mg | CHEWABLE_TABLET | ORAL | Status: DC | PRN

## 2019-04-10 MED ORDER — TETANUS-DIPHTH-ACELL PERTUSSIS 5-2.5-18.5 LF-MCG/0.5 IM SUSP: 0.5000 mL | Freq: Once | INTRAMUSCULAR | Status: DC

## 2019-04-10 MED ORDER — ACETAMINOPHEN 325 MG PO TABS
650.0000 mg | ORAL_TABLET | ORAL | Status: DC | PRN
  Administered 2019-04-11: 650 mg via ORAL
  Filled 2019-04-10: qty 2

## 2019-04-10 MED ORDER — LIDOCAINE HCL (PF) 1 % IJ SOLN: 30.0000 mL | INTRAMUSCULAR | Status: DC | PRN

## 2019-04-10 MED ORDER — COCONUT OIL OIL
1.0000 "application " | TOPICAL_OIL | Status: DC | PRN
  Filled 2019-04-10: qty 120

## 2019-04-10 MED ORDER — WITCH HAZEL-GLYCERIN EX PADS: 1.0000 "application " | MEDICATED_PAD | CUTANEOUS | Status: DC | PRN

## 2019-04-10 MED ORDER — MISOPROSTOL 200 MCG PO TABS: 1000.0000 ug | ORAL_TABLET | Freq: Once | ORAL | Status: AC

## 2019-04-10 MED ORDER — OXYTOCIN 10 UNIT/ML IJ SOLN
INTRAMUSCULAR | Status: AC
  Filled 2019-04-10: qty 2

## 2019-04-10 MED ORDER — AMMONIA AROMATIC IN INHA
RESPIRATORY_TRACT | Status: AC
  Filled 2019-04-10: qty 10

## 2019-04-10 MED ORDER — OXYCODONE-ACETAMINOPHEN 5-325 MG PO TABS: 1.0000 | ORAL_TABLET | ORAL | Status: DC | PRN

## 2019-04-10 MED ORDER — SOD CITRATE-CITRIC ACID 500-334 MG/5ML PO SOLN: 30.0000 mL | ORAL | Status: DC | PRN

## 2019-04-10 MED ORDER — ACETAMINOPHEN 325 MG PO TABS: 650.0000 mg | ORAL_TABLET | ORAL | Status: DC | PRN

## 2019-04-10 MED ORDER — SODIUM CHLORIDE 0.9 % IV SOLN: 2.0000 g | Freq: Once | INTRAVENOUS | Status: DC

## 2019-04-10 MED ORDER — LACTATED RINGERS IV SOLN
500.0000 mL | INTRAVENOUS | Status: DC | PRN
  Administered 2019-04-10: 500 mL via INTRAVENOUS

## 2019-04-10 MED ORDER — ONDANSETRON HCL 4 MG/2ML IJ SOLN: 4.0000 mg | INTRAMUSCULAR | Status: DC | PRN

## 2019-04-10 MED ORDER — OXYCODONE-ACETAMINOPHEN 5-325 MG PO TABS: 2.0000 | ORAL_TABLET | ORAL | Status: DC | PRN

## 2019-04-10 MED ORDER — FENTANYL CITRATE (PF) 100 MCG/2ML IJ SOLN
50.0000 ug | Freq: Once | INTRAMUSCULAR | Status: AC
  Administered 2019-04-10: 50 ug via INTRAVENOUS
  Filled 2019-04-10: qty 2

## 2019-04-10 MED ORDER — DOCUSATE SODIUM 100 MG PO CAPS
100.0000 mg | ORAL_CAPSULE | Freq: Two times a day (BID) | ORAL | Status: DC
  Administered 2019-04-11 – 2019-04-12 (×4): 100 mg via ORAL
  Filled 2019-04-10 (×4): qty 1

## 2019-04-10 MED ORDER — MISOPROSTOL 200 MCG PO TABS
ORAL_TABLET | ORAL | Status: AC
  Administered 2019-04-10: 1000 ug via RECTAL
  Filled 2019-04-10: qty 4

## 2019-04-10 MED ORDER — LACTATED RINGERS IV SOLN: INTRAVENOUS | Status: DC

## 2019-04-10 MED ORDER — ONDANSETRON HCL 4 MG PO TABS: 4.0000 mg | ORAL_TABLET | ORAL | Status: DC | PRN

## 2019-04-10 MED ORDER — IBUPROFEN 600 MG PO TABS
600.0000 mg | ORAL_TABLET | Freq: Four times a day (QID) | ORAL | Status: DC
  Administered 2019-04-11 – 2019-04-12 (×6): 600 mg via ORAL
  Filled 2019-04-10 (×7): qty 1

## 2019-04-10 MED ORDER — PRENATAL MULTIVITAMIN CH
1.0000 | ORAL_TABLET | Freq: Every day | ORAL | Status: DC
  Administered 2019-04-12: 1 via ORAL
  Filled 2019-04-10 (×2): qty 1

## 2019-04-10 MED ORDER — OXYTOCIN BOLUS FROM INFUSION
500.0000 mL | Freq: Once | INTRAVENOUS | Status: AC
  Administered 2019-04-10: 500 mL via INTRAVENOUS

## 2019-04-10 MED ORDER — DIBUCAINE (PERIANAL) 1 % EX OINT: 1.0000 "application " | TOPICAL_OINTMENT | CUTANEOUS | Status: DC | PRN

## 2019-04-10 MED ORDER — ONDANSETRON HCL 4 MG/2ML IJ SOLN: 4.0000 mg | Freq: Four times a day (QID) | INTRAMUSCULAR | Status: DC | PRN

## 2019-04-10 MED ORDER — LIDOCAINE HCL (PF) 1 % IJ SOLN
INTRAMUSCULAR | Status: AC
  Filled 2019-04-10: qty 30

## 2019-04-10 MED ORDER — DIPHENHYDRAMINE HCL 25 MG PO CAPS: 25.0000 mg | ORAL_CAPSULE | Freq: Four times a day (QID) | ORAL | Status: DC | PRN

## 2019-04-10 MED ORDER — OXYTOCIN 40 UNITS IN NORMAL SALINE INFUSION - SIMPLE MED
2.5000 [IU]/h | INTRAVENOUS | Status: DC
  Filled 2019-04-10: qty 1000

## 2019-04-10 MED ORDER — BENZOCAINE-MENTHOL 20-0.5 % EX AERO: 1.0000 "application " | INHALATION_SPRAY | CUTANEOUS | Status: DC | PRN

## 2019-04-10 NOTE — Discharge Summary (Addendum)
Obstetrical Discharge Summary  Patient Name: Melissa Thompson DOB: 1996/02/27 MRN: [id number]  Date of Admission: 04/10/2019 Date of Delivery: 04/10/19 Delivered by: Linda Hedges CNM Date of Discharge: 04/12/2019  Primary OB: Princella Ion  INO:[id number]'O last menstrual period was 09/14/2018 (approximate). EDC Estimated Date of Delivery: 04/15/19 Gestational Age at Delivery: [redacted]w[redacted]d   Antepartum complications:  1. Limited Prenatal Care 2. Miscarriage x2 3. Positive GC on 12/09/18 4. U/s on 1/14 noted: Estimated Fetal Weight:  2875g 15.8%ile   Admitting Diagnosis: Active labor and SROM Secondary Diagnosis: Patient Active Problem List   Diagnosis Date Noted  . Acute blood loss anemia 04/11/2019  . Postpartum hemorrhage, third stage, delivered 04/11/2019  . NSVD (normal spontaneous vaginal delivery) 04/10/2019  . Limited prenatal care 04/10/2019  . History of gonorrhea 04/10/2019   Augmentation: none Complications: [id number]  Intrapartum complications/course:  Delivery Type: spontaneous vaginal delivery Anesthesia: none Placenta: spontaneous Laceration: none Episiotomy: none Newborn Data: Live born female  Birth Weight:  6lb0.3oz (2730g) APGAR: 8, 8  Newborn Delivery   Birth date/time: 04/10/2019 20:24:00 Delivery type: Vaginal, Spontaneous      24yo G4P1021 at 39+4wks presenting in active labor, and SROM with clear fluid.  She quickly progressed to complete and pushed over an intact perineum and delivered the fetal head, followed promptly by the shoulders. She was in control the whole time, and the baby placed on the maternal abdomen. Delayed cord clamping and "Nana V." cut his cord, while he was skin to skin. The placenta delivered spontaneously and intact. No lacerations. Mom and baby tolerated the procedure well.   Postpartum Procedures: none  Post partum course:  Patient had an uncomplicated postpartum course.  By time of discharge on PPD#2, her pain was  controlled on oral pain medications; she had appropriate lochia and was ambulating, voiding without difficulty and tolerating regular diet.  She was deemed stable for discharge to home.    Discharge Physical Exam:  BP 105/66 (BP Location: Right Arm)   Pulse 79   Temp 98.5 F (36.9 C) (Oral)   Resp 20   Ht 5\' 5"  (1.651 m)   Wt 94.3 kg   LMP 09/14/2018 (Approximate)   SpO2 100% Comment: Room Air  BMI 34.61 kg/m   General: alert and no distress Pulm: normal respiratory effort Lochia: appropriate Abdomen: soft, NT Uterine Fundus: firm, below umbilicus Extremities: No evidence of DVT seen on physical exam. No lower extremity edema. [place]:   Labs: CBC Latest Ref Rng & Units 04/11/2019 04/10/2019 11/14/2018  WBC 4.0 - 10.5 K/uL 27.7(H) 18.8(H) 15.6(H)  Hemoglobin 12.0 - 15.0 g/dL 8.8(L) 12.2 12.8  Hematocrit 36.0 - 46.0 % 25.8(L) 36.0 38.5  Platelets 150 - 400 K/uL 178 218 228   O POS Hemoglobin  Date Value Ref Range Status  04/11/2019 8.8 (L) 12.0 - 15.0 g/dL Final    Comment:    REPEATED TO VERIFY   HCT  Date Value Ref Range Status  04/11/2019 25.8 (L) 36.0 - 46.0 % Final    Disposition: stable, discharge to home Baby Feeding: breastmilk Baby Disposition: home with mom  Contraception: TBD  Prenatal Labs:  Blood type/Rh O pos  Antibody screen neg  Rubella Immune  Varicella Immune  RPR NR  HBsAg Neg  HIV NR  GC Pos on 12/09/18  Chlamydia neg  Genetic screening   1 hour GTT 70 on 01/19/19  3 hour GTT   GBS    Rh Immune globulin given: n/a Rubella vaccine given: Immune Varicella  vaccine given: Immunce Tdap vaccine given in AP or PP setting: Ordered for PP Flu vaccine given in AP or PP setting: Ordered for PP  Plan: Jeralyn Ruths was discharged to home in good condition. Follow-up appointment with delivering provider in 6 weeks.  Discharge Instructions: Per After Visit Summary. Activity: Advance as tolerated. Pelvic rest for 6 weeks.   Diet:  Regular Discharge Medications: Allergies as of 04/12/2019   No Known Allergies     Medication List    TAKE these medications   ferrous sulfate 325 (65 FE) MG tablet Take 1 tablet (325 mg total) by mouth 2 (two) times daily with a meal.   prenatal multivitamin Tabs tablet Take 1 tablet by mouth daily at 12 noon.      Outpatient follow up:   Follow-up Information    Haroldine Laws, CNM. Schedule an appointment as soon as possible for a visit in 6 week(s).   Specialty: Certified Nurse Midwife Contact information: [address] [place] [address] [phone]           Signed: Cyril Mourning CNM 04/12/19 9:22 AM

## 2019-04-10 NOTE — H&P (Signed)
OB History & Physical   History of Present Illness:  Chief Complaint:   HPI:  Melissa Thompson is a 24 y.o. G63P0020 female at [redacted]w[redacted]d dated by 18 week u/s.  She presents to L&D for active labor and SROM.  She reports:  -active fetal movement -SROM at 1638 on 04/10/19 -no vaginal bleeding -onset of contractions at 0600 currently every 2 minutes  Pregnancy Issues: 1. Limited Prenatal Care  2 Visits to [facility]  Also [facility] and once at [facility]  [facility] by first ultrasound: 04/21/2019 2. Miscarriage x2 3. Positive GC on 12/09/18 4. U/s on 1/14 noted: Inferior aspect of the placenta is not well seen  18 week U/s reported no previa 5. U/s on 1/14 noted: Estimated Fetal Weight:  2875g 15.8%ile      Maternal Medical History:   Past Medical History:  Diagnosis Date  . Anemia     Past Surgical History:  Procedure Laterality Date  . NO PAST SURGERIES      No Known Allergies  Prior to Admission medications   Medication Sig Start Date End Date Taking? Authorizing Provider  Prenatal Vit-Fe Fumarate-FA (PRENATAL MULTIVITAMIN) TABS tablet Take 1 tablet by mouth daily at 12 noon. 11/14/18  Yes Fayrene Helper, PA-C     Prenatal care site: [facility]  Social History: She  reports that she has never smoked. She has never used smokeless tobacco. She reports previous alcohol use. She reports that she does not use drugs.  Family History: family history is not on file.   Review of Systems: A full review of systems was performed and negative except as noted in the HPI.    Physical Exam:  Vital Signs: BP (!) 111/42   Pulse 85   Temp 98.2 F (36.8 C)   Resp 18   Ht 5\' 5"  (1.651 m)   Wt 94.3 kg   LMP 09/14/2018 (Approximate)   SpO2 100%   BMI 34.61 kg/m   General:   alert and cooperative  Skin:  normal  Neurologic:    Alert & oriented x 3  Lungs:   nl effort  Heart:   regular rate and rhythm  Abdomen:  soft between contractions, gravid  Pelvis:  Exam  deferred.  FHT:  150 BPM  Presentations: cephalic  Cervix:    Dilation: 4 cm   Effacement: 90%   Station:  -3   Consistency: soft   Position: anterior  Extremities: : non-tender, symmetric    EFW:  2875g 15.8%ile on 04/09/19  No results found for this or any previous visit (from the past 24 hour(s)).  Pertinent Results:  Prenatal Labs: Blood type/Rh O pos   Antibody screen neg  Rubella Immune  Varicella Immune  RPR NR  HBsAg Neg  HIV NR  GC Pos on 12/09/18  Chlamydia neg  Genetic screening   1 hour GTT 70 on 01/19/19  3 hour GTT   GBS    FHT: FHR: 155 bpm, variability: moderate,  accelerations:  Present,  decelerations:  Present [place] Category/reactivity:  Category I TOCO: regular, every 2 minutes SVE: Dilation: 4 / Effacement (%): 90 / Station: -1     Ultrasounds 01/21/19 OB Limited Result Date: 11/14/18 COMPARISON:  Prior gestation obstetrical ultrasound 05/04/2017 FINDINGS: Number of Fetuses: 1, Heart Rate:  156 bpm, Movement: Yes, Presentation: Cephalic, Previa: No, Placental Location: Posterior, Amniotic Fluid (Subjective): Normal, BPD:  4cm 18w 2d, Maternal Findings: Cervix:  Closed appearance of the cervix, length  4.7 cm.  Uterus/Adnexae: No abnormality visualized. Intrauterine gestational sac: Single IMPRESSION: Single viable intrauterine gestation, estimated gestational age by BPD measurement 18 weeks, 2 days, Normal fetal heart rate.   US OB Comp + 14 Wk Result Date: 04/09/2019 CLINICAL DATA:  24 year old gravida 1 para 0. Scan for anatomy. No previous prenatal care. Patient is approximately 30 weeks 3 days by LMP. By previous ultrasound performed on 11/14/2026 the [place] is 04/21/2019 and patient is 38 weeks 2 days.  EXAM: OBSTETRICAL ULTRASOUND >14 WKS FINDINGS: Number of Fetuses: 1 Heart Rate:  145 bpm Movement: Present Presentation: Cephalic Previa: Inferior aspect of the placenta is not well seen. Placental Location: Posterior Amniotic Fluid (Subjective): Normal  Amniotic Fluid (Objective): AFI = 11.0 centimeter cm (5%ile= 7.7 cm, 95%= 24.9 cm for 36 wks) FETAL BIOMETRY BPD: 8.3cm 33w 2d HC:   31.8cm 35w 6d AC:   32.8cm 36w 5d FL:   7.2cm 36w 5d Current Mean GA: 35w 4d [place] EDC: 05/10/2019 First ultrasound: 38 weeks 2 days. EDC by first ultrasound: 04/21/2019. Estimated Fetal Weight:  2875g 15.8%ile  FETAL ANATOMY Lateral Ventricles: Appears normal Thalami/CSP: Appears normal Posterior Fossa:  Appears normal Nuchal Region: Appears normal   NFT= none applicable Upper Lip: Appears normal Spine: Appears normal 4 Chamber Heart on Left: Appears normal LVOT: Appears normal RVOT: Appears normal Stomach on Left: Appears normal 3 Vessel Cord: Appears normal Cord Insertion site: Not visualized Kidneys: Appears normal Bladder: Appears normal Extremities: Limited views appear normal Sex: Female Technically difficult due to: Gestational age Maternal Findings: Cervix:  Not evaluated due to gestational age IMPRESSION: 1. Single living intrauterine fetus in cephalic presentation. 2. Amniotic fluid volume is normal. 3. Inferior aspect of the placenta is not well seen. 4. Estimated fetal weight by ultrasound is 15.8% for gestational age of [redacted] weeks. 5. No fetal anomalies. Electronically Signed   By: Nolon Nations M.D.   On: 04/09/2019 16:47     Assessment:  Melissa Thompson is a 24 y.o. G56P0020 female at [redacted]w[redacted]d in active labor with SROM.  Plan:  1. Admit to Labor & Delivery; consents reviewed and obtained  2. Fetal Well being  - Fetal Tracing: cat 1 - GBS unknown - collect rapid GBS  - Presentation: Vtx confirmed by SVE   3. Routine OB: - Prenatal labs reviewed, as above - Rh pos - CBC & T&S on admit - Clear fluids, IVF  4. Monitoring of Labor -  Contractions external toco in place -  Plan for continuous fetal monitoring  -  Maternal pain control as desired: IVPM, epidural - Anticipate vaginal delivery  5. Post Partum Planning: - Infant feeding: Breastfeeding -  Contraception: TBD  Linda Hedges, CNM 04/10/2019 6:57 PM

## 2019-04-10 NOTE — Progress Notes (Signed)
Delivery Note  Date of delivery: 04/10/2019 Estimated Date of Delivery: 04/15/19 Patient's last menstrual period was 09/14/2018 (approximate). EGA: [redacted]w[redacted]d  Delivery Note At 8:24 PM a viable female was delivered via Vaginal, Spontaneous (Presentation: OA).  APGAR: 8, 8; weight  pending.   Placenta status: Spontaneous, Intact.  Cord: 3VC with the following complications: none.    Anesthesia:  none Episiotomy:  none Lacerations:  none Suture Repair: n/a Est. Blood Loss (mL):   Mom to postpartum.  Baby to Couplet care / Skin to Skin.   First Stage: Labor onset: 1638 Augmentation : none Analgesia /Anesthesia intrapartum: none SROM at 1638  Second Stage: Complete dilation at unknown Onset of pushing at 2018 FHR second stage 155 bpm, Mod variability, early decelerations Delivery at 2024 on 04/10/2019  Melissa Thompson came in [facility] and in active labor. She progressed very quickly to complete before she was able to get her epidural. She had a spontaneous vaginal birth of a live female over an intact perineum. The fetal head was delivered in OA position with restitution to LOA. No nuchal cord assessed. Anterior then posterior shoulders delivered spontaneously with minimal assistance. Baby placed on mom's abdomen and attended to by transition RN. Cord clamped and cut after 5 mins by baby's grandmother. Cord blood obtained for newborn labs.  Third Stage: Placenta delivered intact with 3VC at 2044 Placenta disposition: To pathology Uterine tone firm / bleeding heavy - of Cytotec given in addition to the Pitocin bolus.  Heavy bleeding resolved before provider left the room. IV pitocin given for hemorrhage prophylaxis  No laceration identified  Est. Blood Loss (mL):  Complications: Heavy bleeding at , bleeding slowed to appropriate after of Cytotec and routine Pitocin bolus.  Newborn: Birth Weight: 6lb0.3oz [phone]) Apgar Scores: 8/8 Feeding planned:  Breastfeeding   Cyril Mourning, CNM 04/11/2019 10:42 AM

## 2019-04-11 DIAGNOSIS — D62 Acute posthemorrhagic anemia: Secondary | ICD-10-CM | POA: Diagnosis not present

## 2019-04-11 LAB — CHLAMYDIA/NGC RT PCR (ARMC ONLY)
Chlamydia Tr: NOT DETECTED
N gonorrhoeae: NOT DETECTED

## 2019-04-11 LAB — CBC
HCT: 25.8 % — ABNORMAL LOW (ref 36.0–46.0)
Hemoglobin: 8.8 g/dL — ABNORMAL LOW (ref 12.0–15.0)
MCH: 32.2 pg (ref 26.0–34.0)
MCHC: 34.1 g/dL (ref 30.0–36.0)
MCV: 94.5 fL (ref 80.0–100.0)
Platelets: 178 10*3/uL (ref 150–400)
RBC: 2.73 MIL/uL — ABNORMAL LOW (ref 3.87–5.11)
RDW: 14 % (ref 11.5–15.5)
WBC: 27.7 10*3/uL — ABNORMAL HIGH (ref 4.0–10.5)
nRBC: 0 % (ref 0.0–0.2)

## 2019-04-11 LAB — RPR: RPR Ser Ql: NONREACTIVE

## 2019-04-11 MED ORDER — FERROUS SULFATE 325 (65 FE) MG PO TABS
325.0000 mg | ORAL_TABLET | Freq: Two times a day (BID) | ORAL | Status: DC
  Administered 2019-04-11 – 2019-04-12 (×3): 325 mg via ORAL
  Filled 2019-04-11 (×3): qty 1

## 2019-04-11 NOTE — Progress Notes (Signed)
Post Partum Day 1  Subjective: no complaints, up ad lib, voiding, tolerating PO, + flatus and has had her first BM  Doing well, no concerns. Ambulating without difficulty, pain managed with PO meds, tolerating regular diet, and voiding without difficulty.   No fever/chills, chest pain, shortness of breath, nausea/vomiting, or leg pain. No nipple or breast pain.   Objective: BP 101/66 (BP Location: Right Arm)   Pulse 89   Temp 98.4 F (36.9 C) (Oral)   Resp 18   Ht 5\' 5"  (1.651 m)   Wt 94.3 kg   LMP 09/14/2018 (Approximate)   SpO2 99%   BMI 34.61 kg/m    Physical Exam:  General: alert and cooperative Breasts: soft/nontender CV: RRR Pulm: nl effort, CTABL Abdomen: soft, non-tender Uterine Fundus: firm Incision: n/a Perineum: minimal edema, intact Lochia: appropriate DVT Evaluation: No evidence of DVT seen on physical exam.  Recent Labs    04/10/19 1854 04/11/19 0710  HGB 12.2 8.8*  HCT 36.0 25.8*  WBC 18.8* 27.7*  PLT 218 178    Assessment/Plan: 23 y.o. G3P0020 postpartum day # 1  -Continue routine postpartum care -Inadequate GBS treatment - Peds likely to keep 48 hours -Lactation consult PRN for breastfeeding  -Acute blood loss anemia - hemodynamically stable and asymptomatic; start PO ferrous sulfate BID with stool softeners  -Immunization status: Needs Tdap and flu prior to discharge  Disposition: Continue inpatient postpartum care    LOS: 1 day   Kaesha Kirsch, CNM 04/11/2019, 9:26 AM

## 2019-04-11 NOTE — Lactation Note (Signed)
This note was copied from a baby's chart. Lactation Consultation Note  Patient Name: Melissa Thompson [id number] Date: 04/11/2019 Reason for consult: Follow-up assessment;Primapara;Term;Nipple pain/trauma  Mom has been breast feeding [name] continually since 7am.  Mom has large breasts, but  Has been able to latch him to the breast on her own, but is letting him slip to the tip of the nipple causing some pinching pain.  Assisted mom with better positioning with pillow support and nose and chin touching the breast.  Once we achieved a deeper latch, mom reports the pain being some better, but still sore.  Mom says she does not know how much longer she can keep putting him to the breast without a break and starts requesting a bottle.  Mom agreed to [facility] via finger feed with curved tip syringe.  Lactation finger fed him while mom ate her dinner.  [facility] set up in room per mom's request, but still agrees to try to keep putting him to the breast through the night.    Maternal Data Formula Feeding for Exclusion: No Has patient been taught Hand Expression?: Yes Does the patient have breastfeeding experience prior to this delivery?: No(P1)  Feeding Feeding Type: Donor Breast Milk  LATCH Score Latch: Grasps breast easily, tongue down, lips flanged, rhythmical sucking.  Audible Swallowing: A few with stimulation  Type of Nipple: Everted at rest and after stimulation  Comfort (Breast/Nipple): Filling, red/small blisters or bruises, mild/mod discomfort(Large breasts)  Hold (Positioning): Assistance needed to correctly position infant at breast and maintain latch.  LATCH Score: 7  Interventions Interventions: Assisted with latch;Skin to skin;Breast massage;Hand express;Breast compression;Adjust position;Support pillows;Position options;Coconut oil  Lactation Tools Discussed/Used Tools: Pump;Coconut oil;Other (comment)(Finger fed using curved tip syringe per mom's choice) Breast pump type:  Double-Electric Breast Pump WIC Program: Yes Pump Review: Setup, frequency, and cleaning;Milk Storage;Other (comment) Initiated by:: S.Shaena Parkerson,RN,BSN,IBCLC Date initiated:: 04/11/19(Set up but did not pump for now)   Consult Status Consult Status: Follow-up Follow-up type: Call as needed    Louis Meckel 04/11/2019, 8:13 PM

## 2019-04-11 NOTE — Lactation Note (Signed)
This note was copied from a baby's chart. Lactation Consultation Note  Patient Name: Boy Khamari Yousuf Today's Date: 04/11/2019   When entered room, Arbie Cookey had just breast fed on left breast for 40 minutes.  He was rooting and fussing.  Mom was willing to put him back to the right breast.  He opened his mouth wide with flanged lips and tongue under breast.  After a few attempts, he pulled breast in and began strong, rhythmic sucking with occasional swallow.  Mom reports slight nipple tenderness when he initially latches that eases up shortly into feeding once he pulls breast in further.  Explained transient nipple tenderness while breast is adjusting to frequent breast feeding.  No nipple trauma noted.  [name] was sleepy through the night, but has been cluster feeding this am per mom.  Demonstrated hand expression and encouraged to rub colostrum on nipple after breast feed to prevent bacteria, lubricate and help with discomfort.  Coconut oil given and instructed in use.  Reviewed normal newborn stomach size, supply and demand, normal course of lactation and routine newborn feeding patterns.  Lactation name and number written on white board and encouraged to call with any questions, concerns or assistance.      Maternal Data    Feeding    LATCH Score                   Interventions    Lactation Tools Discussed/Used     Consult Status      Louis Meckel 04/11/2019, 5:30 PM

## 2019-04-11 NOTE — Plan of Care (Signed)
Instructed in Fall Prevention and POC;Pt. V/O

## 2019-04-12 MED ORDER — FERROUS SULFATE 325 (65 FE) MG PO TABS: 325.0000 mg | ORAL_TABLET | Freq: Two times a day (BID) | ORAL | 1 refills | Status: AC

## 2019-04-12 NOTE — Progress Notes (Signed)
Patient sleeping in the bed with infant on her chest. RN discussed safe sleep education with patient. Patient states "I'm a light sleeper and I do not move when I'm sleeping." RN reiterated safety concerns with patient and discussed safe sleep again. RN encouraged patient to place infant back in the bassinet if feeling sleepy and to use call bell if she needs assistance. Patient verbalizes understanding.

## 2019-04-12 NOTE — Progress Notes (Signed)
Discharge instructions given. Patient verbalizes understanding of teaching.  

## 2019-04-12 NOTE — Lactation Note (Signed)
This note was copied from a baby's chart. Lactation Consultation Note  Patient Name: Melissa Thompson [id number] Date: 04/12/2019 Reason for consult: Follow-up assessment;Primapara;Term;Hyperbilirubinemia Mom is breast feeding Melissa Thompson without assistance now.  Still have to keep encouraging mom to make sure he has deep latch and uses pillow support.  Mom reports nipple discomfort is much better and more tolerable.  36 hour bilirubin was 13.2.  Labs drawn.  Lactation name and number written on white board and encouraged mom to call with any questions, concerns or assistance.   Maternal Data Formula Feeding for Exclusion: No Has patient been taught Hand Expression?: Yes Does the patient have breastfeeding experience prior to this delivery?: No  Feeding Feeding Type: Breast Fed  LATCH Score Latch: Grasps breast easily, tongue down, lips flanged, rhythmical sucking.  Audible Swallowing: A few with stimulation  Type of Nipple: Everted at rest and after stimulation  Comfort (Breast/Nipple): Filling, red/small blisters or bruises, mild/mod discomfort  Hold (Positioning): No assistance needed to correctly position infant at breast.  LATCH Score: 8  Interventions Interventions: Breast compression;Support pillows;Coconut oil  Lactation Tools Discussed/Used WIC Program: Yes Pump Review: Setup, frequency, and cleaning;Milk Storage;Other (comment) Initiated by:: S.Tiler Brandis,RN,BSN,IBCLC Date initiated:: 04/11/19   Consult Status Consult Status: Follow-up Date: 04/12/19 Follow-up type: Call as needed    Louis Meckel 04/12/2019, 9:55 AM

## 2019-04-12 NOTE — Lactation Note (Signed)
This note was copied from a baby's chart. Lactation Consultation Note  Patient Name: Melissa Thompson Today's Date: 04/12/2019   Bilirubin levels were normal at 4 pm draw and mom and Melissa Thompson are to be discharged home. Mom has large breasts, but have increased in size and are fuller and warm to touch, right more than left.    Reminded of feeding cues and need for frequent breast feeding which mom has been allowing Melissa Thompson and importance of draining breasts to prevent engorgement.  When demonstrated hand expression, noted transitional breast milk could be expressed.  Mom reports breasts are not as full after breast feeding, but will fill up again before next time to breast feed.  Remaining 50 ml of donor breast milk, lot # [id number], given to mom to take home with her if needed.  Discussed hand expression and use of manual breast pump if needed to soften areola for deeper latch.  Reviewed collection, storage, cleaning, labeling and handling of expressed breast milk.  Lactation community resource hand out with contact numbers given and reviewed encouraging to call with any questions, concerns or assistance.    Maternal Data    Feeding Feeding Type: Donor Breast Milk  LATCH Score                   Interventions    Lactation Tools Discussed/Used     Consult Status      Louis Meckel 04/12/2019, 7:32 PM

## 2019-04-12 NOTE — Discharge Instructions (Signed)
Postpartum Care After Vaginal Delivery This sheet gives you information about how to care for yourself from the time you deliver your baby to up to 6-12 weeks after delivery (postpartum period). Your health care provider may also give you more specific instructions. If you have problems or questions, contact your health care provider. Follow these instructions at home: Vaginal bleeding  It is normal to have vaginal bleeding (lochia) after delivery. Wear a sanitary pad for vaginal bleeding and discharge. ? During the first week after delivery, the amount and appearance of lochia is often similar to a menstrual period. ? Over the next few weeks, it will gradually decrease to a dry, yellow-brown discharge. ? For most women, lochia stops completely by 4-6 weeks after delivery. Vaginal bleeding can vary from woman to woman.  Change your sanitary pads frequently. Watch for any changes in your flow, such as: ? A sudden increase in volume. ? A change in color. ? Large blood clots.  If you pass a blood clot from your vagina, save it and call your health care provider to discuss. Do not flush blood clots down the toilet before talking with your health care provider.  Do not use tampons or douches until your health care provider says this is safe.  If you are not breastfeeding, your period should return 6-8 weeks after delivery. If you are feeding your child breast milk only (exclusive breastfeeding), your period may not return until you stop breastfeeding. Perineal care  Keep the area between the vagina and the anus (perineum) clean and dry as told by your health care provider. Use medicated pads and pain-relieving sprays and creams as directed.  If you had a cut in the perineum (episiotomy) or a tear in the vagina, check the area for signs of infection until you are healed. Check for: ? More redness, swelling, or pain. ? Fluid or blood coming from the cut or tear. ? Warmth. ? Pus or a bad  smell.  You may be given a squirt bottle to use instead of wiping to clean the perineum area after you go to the bathroom. As you start healing, you may use the squirt bottle before wiping yourself. Make sure to wipe gently.  To relieve pain caused by an episiotomy, a tear in the vagina, or swollen veins in the anus (hemorrhoids), try taking a warm sitz bath 2-3 times a day. A sitz bath is a warm water bath that is taken while you are sitting down. The water should only come up to your hips and should cover your buttocks. Breast care  Within the first few days after delivery, your breasts may feel heavy, full, and uncomfortable (breast engorgement). Milk may also leak from your breasts. Your health care provider can suggest ways to help relieve the discomfort. Breast engorgement should go away within a few days.  If you are breastfeeding: ? Wear a bra that supports your breasts and fits you well. ? Keep your nipples clean and dry. Apply creams and ointments as told by your health care provider. ? You may need to use breast pads to absorb milk that leaks from your breasts. ? You may have uterine contractions every time you breastfeed for up to several weeks after delivery. Uterine contractions help your uterus return to its normal size. ? If you have any problems with breastfeeding, work with your health care provider or lactation consultant.  If you are not breastfeeding: ? Avoid touching your breasts a lot. Doing this can make   your breasts produce more milk. ? Wear a good-fitting bra and use cold packs to help with swelling. ? Do not squeeze out (express) milk. This causes you to make more milk. Intimacy and sexuality  Ask your health care provider when you can engage in sexual activity. This may depend on: ? Your risk of infection. ? How fast you are healing. ? Your comfort and desire to engage in sexual activity.  You are able to get pregnant after delivery, even if you have not had  your period. If desired, talk with your health care provider about methods of birth control (contraception). Medicines  Take over-the-counter and prescription medicines only as told by your health care provider.  If you were prescribed an antibiotic medicine, take it as told by your health care provider. Do not stop taking the antibiotic even if you start to feel better. Activity  Gradually return to your normal activities as told by your health care provider. Ask your health care provider what activities are safe for you.  Rest as much as possible. Try to rest or take a nap while your baby is sleeping. Eating and drinking   Drink enough fluid to keep your urine pale yellow.  Eat high-fiber foods every day. These may help prevent or relieve constipation. High-fiber foods include: ? Whole grain cereals and breads. ? Brown rice. ? Beans. ? Fresh fruits and vegetables.  Do not try to lose weight quickly by cutting back on calories.  Take your prenatal vitamins until your postpartum checkup or until your health care provider tells you it is okay to stop. Lifestyle  Do not use any products that contain nicotine or tobacco, such as cigarettes and e-cigarettes. If you need help quitting, ask your health care provider.  Do not drink alcohol, especially if you are breastfeeding. General instructions  Keep all follow-up visits for you and your baby as told by your health care provider. Most women visit their health care provider for a postpartum checkup within the first 3-6 weeks after delivery. Contact a health care provider if:  You feel unable to cope with the changes that your child brings to your life, and these feelings do not go away.  You feel unusually sad or worried.  Your breasts become red, painful, or hard.  You have a fever.  You have trouble holding urine or keeping urine from leaking.  You have little or no interest in activities you used to enjoy.  You have not  breastfed at all and you have not had a menstrual period for 12 weeks after delivery.  You have stopped breastfeeding and you have not had a menstrual period for 12 weeks after you stopped breastfeeding.  You have questions about caring for yourself or your baby.  You pass a blood clot from your vagina. Get help right away if:  You have chest pain.  You have difficulty breathing.  You have sudden, severe leg pain.  You have severe pain or cramping in your lower abdomen.  You bleed from your vagina so much that you fill more than one sanitary pad in one hour. Bleeding should not be heavier than your heaviest period.  You develop a severe headache.  You faint.  You have blurred vision or spots in your vision.  You have bad-smelling vaginal discharge.  You have thoughts about hurting yourself or your baby. If you ever feel like you may hurt yourself or others, or have thoughts about taking your own life, get help  right away. You can go to the nearest emergency department or call:  Your local emergency services (911 in the [place]  A suicide crisis helpline, such as the [place] at 773-567-8467. This is open 24 hours a day. Summary  The period of time right after you deliver your newborn up to 6-12 weeks after delivery is called the postpartum period.  Gradually return to your normal activities as told by your health care provider.  Keep all follow-up visits for you and your baby as told by your health care provider. This information is not intended to replace advice given to you by your health care provider. Make sure you discuss any questions you have with your health care provider. Document Revised: 03/15/2017 Document Reviewed: 12/24/2016 Elsevier Patient Education  2020 [employer].   Postpartum Baby Blues The postpartum period begins right after the birth of a baby. During this time, there is often a lot of joy and excitement. It is also  a time of many changes in the life of the parents. No matter how many times a mother gives birth, each child brings new challenges to the family, including different ways of relating to one another. It is common to have feelings of excitement along with confusing changes in moods, emotions, and thoughts. You may feel happy one minute and sad or stressed the next. These feelings of sadness usually happen in the period right after you have your baby, and they go away within a week or two. This is called the "baby blues." What are the causes? There is no known cause of baby blues. It is likely caused by a combination of factors. However, changes in hormone levels after childbirth are believed to trigger some of the symptoms. Other factors that can play a role in these mood changes include:  Lack of sleep.  Stressful life events, such as poverty, caring for a loved one, or death of a loved one.  Genetics. What are the signs or symptoms? Symptoms of this condition include:  Brief changes in mood, such as going from extreme happiness to sadness.  Decreased concentration.  Difficulty sleeping.  Crying spells and tearfulness.  Loss of appetite.  Irritability.  Anxiety. If the symptoms of baby blues last for more than 2 weeks or become more severe, you may have postpartum depression. How is this diagnosed? This condition is diagnosed based on an evaluation of your symptoms. There are no medical or lab tests that lead to a diagnosis, but there are various questionnaires that a health care provider may use to identify women with the baby blues or postpartum depression. How is this treated? Treatment is not needed for this condition. The baby blues usually go away on their own in 1-2 weeks. Social support is often all that is needed. You will be encouraged to get adequate sleep and rest. Follow these instructions at home: Lifestyle      Get as much rest as you can. Take a nap when the baby  sleeps.  Exercise regularly as told by your health care provider. Some women find yoga and walking to be helpful.  Eat a balanced and nourishing diet. This includes plenty of fruits and vegetables, whole grains, and lean proteins.  Do little things that you enjoy. Have a cup of tea, take a bubble bath, read your favorite magazine, or listen to your favorite music.  Avoid alcohol.  Ask for help with household chores, cooking, grocery shopping, or running errands. Do not try  to do everything yourself. Consider hiring a postpartum doula to help. This is a professional who specializes in providing support to new mothers. °· Try not to make any major life changes during pregnancy or right after giving birth. This can add stress. °General instructions °· Talk to people close to you about how you are feeling. Get support from your partner, family members, friends, or other new moms. You may want to join a support group. °· Find ways to cope with stress. This may include: °? Writing your thoughts and feelings in a journal. °? Spending time outside. °? Spending time with people who make you laugh. °· Try to stay positive in how you think. Think about the things you are grateful for. °· Take over-the-counter and prescription medicines only as told by your health care provider. °· Let your health care provider know if you have any concerns. °· Keep all postpartum visits as told by your health care provider. This is important. °Contact a health care provider if: °· Your baby blues do not go away after 2 weeks. °Get help right away if: °· You have thoughts of taking your own life (suicidal thoughts). °· You think you may harm the baby or other people. °· You see or hear things that are not there (hallucinations). °Summary °· After giving birth, you may feel happy one minute and sad or stressed the next. Feelings of sadness that happen right after the baby is born and go away after a week or two are called the "baby  blues." °· You can manage the baby blues by getting enough rest, eating a healthy diet, exercising, spending time with supportive people, and finding ways to cope with stress. °· If feelings of sadness and stress last longer than 2 weeks or get in the way of caring for your baby, talk to your health care provider. This may mean you have postpartum depression. °This information is not intended to replace advice given to you by your health care provider. Make sure you discuss any questions you have with your health care provider. °Document Revised: 07/04/2018 Document Reviewed: 05/08/2016 °Elsevier Patient Education © 2020 [employer] ° ° °Breastfeeding ° °Choosing to breastfeed is one of the best decisions you can make for yourself and your baby. A change in hormones during pregnancy causes your breasts to make breast milk in your milk-producing glands. Hormones prevent breast milk from being released before your baby is born. They also prompt milk flow after birth. Once breastfeeding has begun, thoughts of your baby, as well as his or her sucking or crying, can stimulate the release of milk from your milk-producing glands. °Benefits of breastfeeding °Research shows that breastfeeding offers many health benefits for infants and mothers. It also offers a cost-free and convenient way to feed your baby. °For your baby °· Your first milk (colostrum) helps your baby's digestive system to function better. °· Special cells in your milk (antibodies) help your baby to fight off infections. °· Breastfed babies are less likely to develop asthma, allergies, obesity, or type 2 diabetes. They are also at lower risk for sudden infant death syndrome (SIDS). °· Nutrients in breast milk are better able to meet your baby’s needs compared to infant formula. °· Breast milk improves your baby's brain development. °For you °· Breastfeeding helps to create a very special bond between you and your baby. °· Breastfeeding is convenient.  Breast milk costs nothing and is always available at the correct temperature. °· Breastfeeding helps to burn   calories. It helps you to lose the weight that you gained during pregnancy.  Breastfeeding makes your uterus return faster to its size before pregnancy. It also slows bleeding (lochia) after you give birth.  Breastfeeding helps to lower your risk of developing type 2 diabetes, osteoporosis, rheumatoid arthritis, cardiovascular disease, and breast, ovarian, uterine, and endometrial cancer later in life. Breastfeeding basics Starting breastfeeding  Find a comfortable place to sit or lie down, with your neck and back well-supported.  Place a pillow or a rolled-up blanket under your baby to bring him or her to the level of your breast (if you are seated). Nursing pillows are specially designed to help support your arms and your baby while you breastfeed.  Make sure that your baby's tummy (abdomen) is facing your abdomen.  Gently massage your breast. With your fingertips, massage from the outer edges of your breast inward toward the nipple. This encourages milk flow. If your milk flows slowly, you may need to continue this action during the feeding.  Support your breast with 4 fingers underneath and your thumb above your nipple (make the letter "C" with your hand). Make sure your fingers are well away from your nipple and your baby's mouth.  Stroke your baby's lips gently with your finger or nipple.  When your baby's mouth is open wide enough, quickly bring your baby to your breast, placing your entire nipple and as much of the areola as possible into your baby's mouth. The areola is the colored area around your nipple. ? More areola should be visible above your baby's upper lip than below the lower lip. ? Your baby's lips should be opened and extended outward (flanged) to ensure an adequate, comfortable latch. ? Your baby's tongue should be between his or her lower gum and your  breast.  Make sure that your baby's mouth is correctly positioned around your nipple (latched). Your baby's lips should create a seal on your breast and be turned out (everted).  It is common for your baby to suck about 2-3 minutes in order to start the flow of breast milk. Latching Teaching your baby how to latch onto your breast properly is very important. An improper latch can cause nipple pain, decreased milk supply, and poor weight gain in your baby. Also, if your baby is not latched onto your nipple properly, he or she may swallow some air during feeding. This can make your baby fussy. Burping your baby when you switch breasts during the feeding can help to get rid of the air. However, teaching your baby to latch on properly is still the best way to prevent fussiness from swallowing air while breastfeeding. Signs that your baby has successfully latched onto your nipple  Silent tugging or silent sucking, without causing you pain. Infant's lips should be extended outward (flanged).  Swallowing heard between every 3-4 sucks once your milk has started to flow (after your let-down milk reflex occurs).  Muscle movement above and in front of his or her ears while sucking. Signs that your baby has not successfully latched onto your nipple  Sucking sounds or smacking sounds from your baby while breastfeeding.  Nipple pain. If you think your baby has not latched on correctly, slip your finger into the corner of your baby's mouth to break the suction and place it between your baby's gums. Attempt to start breastfeeding again. Signs of successful breastfeeding Signs from your baby  Your baby will gradually decrease the number of sucks or will completely stop  sucking. °· Your baby will fall asleep. °· Your baby's body will relax. °· Your baby will retain a small amount of milk in his or her mouth. °· Your baby will let go of your breast by himself or herself. °Signs from you °· Breasts that have  increased in firmness, weight, and size 1-3 hours after feeding. °· Breasts that are softer immediately after breastfeeding. °· Increased milk volume, as well as a change in milk consistency and color by the fifth day of breastfeeding. °· Nipples that are not sore, cracked, or bleeding. °Signs that your baby is getting enough milk °· Wetting at least 1-2 diapers during the first 24 hours after birth. °· Wetting at least 5-6 diapers every 24 hours for the first week after birth. The urine should be clear or pale yellow by the age of 5 days. °· Wetting 6-8 diapers every 24 hours as your baby continues to grow and develop. °· At least 3 stools in a 24-hour period by the age of 5 days. The stool should be soft and yellow. °· At least 3 stools in a 24-hour period by the age of 7 days. The stool should be seedy and yellow. °· No loss of weight greater than 10% of birth weight during the first 3 days of life. °· Average weight gain of 4-7 oz (113-198 g) per week after the age of 4 days. °· Consistent daily weight gain by the age of 5 days, without weight loss after the age of 2 weeks. °After a feeding, your baby may spit up a small amount of milk. This is normal. °Breastfeeding frequency and duration °Frequent feeding will help you make more milk and can prevent sore nipples and extremely full breasts (breast engorgement). Breastfeed when you feel the need to reduce the fullness of your breasts or when your baby shows signs of hunger. This is called "breastfeeding on demand." Signs that your baby is hungry include: °· Increased alertness, activity, or restlessness. °· Movement of the head from side to side. °· Opening of the mouth when the corner of the mouth or cheek is stroked (rooting). °· Increased sucking sounds, smacking lips, cooing, sighing, or squeaking. °· Hand-to-mouth movements and sucking on fingers or hands. °· Fussing or crying. °Avoid introducing a pacifier to your baby in the first 4-6 weeks after your  baby is born. After this time, you may choose to use a pacifier. Research has shown that pacifier use during the first year of a baby's life decreases the risk of sudden infant death syndrome (SIDS). °Allow your baby to feed on each breast as long as he or she wants. When your baby unlatches or falls asleep while feeding from the first breast, offer the second breast. Because newborns are often sleepy in the first few weeks of life, you may need to awaken your baby to get him or her to feed. °Breastfeeding times will vary from baby to baby. However, the following rules can serve as a guide to help you make sure that your baby is properly fed: °· Newborns (babies 4 weeks of age or younger) may breastfeed every 1-3 hours. °· Newborns should not go without breastfeeding for longer than 3 hours during the day or 5 hours during the night. °· You should breastfeed your baby a minimum of 8 times in a 24-hour period. °Breast milk pumping ° °  ° °Pumping and storing breast milk allows you to make sure that your baby is exclusively fed your breast milk,   even at times when you are unable to breastfeed. This is especially important if you go back to work while you are still breastfeeding, or if you are not able to be present during feedings. Your lactation consultant can help you find a method of pumping that works best for you and give you guidelines about how long it is safe to store breast milk. °Caring for your breasts while you breastfeed °Nipples can become dry, cracked, and sore while breastfeeding. The following recommendations can help keep your breasts moisturized and healthy: °· Avoid using soap on your nipples. °· Wear a supportive bra designed especially for nursing. Avoid wearing underwire-style bras or extremely tight bras (sports bras). °· Air-dry your nipples for 3-4 minutes after each feeding. °· Use only cotton bra pads to absorb leaked breast milk. Leaking of breast milk between feedings is normal. °· Use  lanolin on your nipples after breastfeeding. Lanolin helps to maintain your skin's normal moisture barrier. Pure lanolin is not harmful (not toxic) to your baby. You may also hand express a few drops of breast milk and gently massage that milk into your nipples and allow the milk to air-dry. °In the first few weeks after giving birth, some women experience breast engorgement. Engorgement can make your breasts feel heavy, warm, and tender to the touch. Engorgement peaks within 3-5 days after you give birth. The following recommendations can help to ease engorgement: °· Completely empty your breasts while breastfeeding or pumping. You may want to start by applying warm, moist heat (in the shower or with warm, water-soaked hand towels) just before feeding or pumping. This increases circulation and helps the milk flow. If your baby does not completely empty your breasts while breastfeeding, pump any extra milk after he or she is finished. °· Apply ice packs to your breasts immediately after breastfeeding or pumping, unless this is too uncomfortable for you. To do this: °? Put ice in a plastic bag. °? Place a towel between your skin and the bag. °? Leave the ice on for 20 minutes, 2-3 times a day. °· Make sure that your baby is latched on and positioned properly while breastfeeding. °If engorgement persists after 48 hours of following these recommendations, contact your health care provider or a lactation consultant. °Overall health care recommendations while breastfeeding °· Eat 3 healthy meals and 3 snacks every day. Well-nourished mothers who are breastfeeding need an additional 450-500 calories a day. You can meet this requirement by increasing the amount of a balanced diet that you eat. °· Drink enough water to keep your urine pale yellow or clear. °· Rest often, relax, and continue to take your prenatal vitamins to prevent fatigue, stress, and low vitamin and mineral levels in your body (nutrient  deficiencies). °· Do not use any products that contain nicotine or tobacco, such as cigarettes and e-cigarettes. Your baby may be harmed by chemicals from cigarettes that pass into breast milk and exposure to secondhand smoke. If you need help quitting, ask your health care provider. °· Avoid alcohol. °· Do not use illegal drugs or marijuana. °· Talk with your health care provider before taking any medicines. These include over-the-counter and prescription medicines as well as vitamins and herbal supplements. Some medicines that may be harmful to your baby can pass through breast milk. °· It is possible to become pregnant while breastfeeding. If birth control is desired, ask your health care provider about options that will be safe while breastfeeding your baby. °Where to find more information: °  [employer]: www.llli.org Contact a health care provider if:  You feel like you want to stop breastfeeding or have become frustrated with breastfeeding.  Your nipples are cracked or bleeding.  Your breasts are red, tender, or warm.  You have: ? Painful breasts or nipples. ? A swollen area on either breast. ? A fever or chills. ? Nausea or vomiting. ? Drainage other than breast milk from your nipples.  Your breasts do not become full before feedings by the fifth day after you give birth.  You feel sad and depressed.  Your baby is: ? Too sleepy to eat well. ? Having trouble sleeping. ? More than 48 week old and wetting fewer than 6 diapers in a 24-hour period. ? Not gaining weight by 13 days of age.  Your baby has fewer than 3 stools in a 24-hour period.  Your baby's skin or the white parts of his or her eyes become yellow. Get help right away if:  Your baby is overly tired (lethargic) and does not want to wake up and feed.  Your baby develops an unexplained fever. Summary  Breastfeeding offers many health benefits for infant and mothers.  Try to breastfeed your infant when  he or she shows early signs of hunger.  Gently tickle or stroke your baby's lips with your finger or nipple to allow the baby to open his or her mouth. Bring the baby to your breast. Make sure that much of the areola is in your baby's mouth. Offer one side and burp the baby before you offer the other side.  Talk with your health care provider or lactation consultant if you have questions or you face problems as you breastfeed. This information is not intended to replace advice given to you by your health care provider. Make sure you discuss any questions you have with your health care provider. Document Revised: 06/06/2017 Document Reviewed: 04/13/2016 Elsevier Patient Education  [place].   Iron-Rich Diet  Iron is a mineral that helps your body to produce hemoglobin. Hemoglobin is a protein in red blood cells that carries oxygen to your body's tissues. Eating too little iron may cause you to feel weak and tired, and it can increase your risk of infection. Iron is naturally found in many foods, and many foods have iron added to them (iron-fortified foods). You may need to follow an iron-rich diet if you do not have enough iron in your body due to certain medical conditions. The amount of iron that you need each day depends on your age, your sex, and any medical conditions you have. Follow instructions from your health care provider or a diet and nutrition specialist (dietitian) about how much iron you should eat each day. What are tips for following this plan? Reading food labels  Check food labels to see how many milligrams (mg) of iron are in each serving. Cooking  Cook foods in pots and pans that are made from iron.  Take these steps to make it easier for your body to absorb iron from certain foods: ? Soak beans overnight before cooking. ? Soak whole grains overnight and drain them before using. ? Ferment flours before baking, such as by using yeast in bread dough. Meal  planning  When you eat foods that contain iron, you should eat them with foods that are high in vitamin C. These include oranges, peppers, tomatoes, potatoes, and mango. Vitamin C helps your body to absorb iron. General information  Take iron supplements  only as told by your health care provider. An overdose of iron can be life-threatening. If you were prescribed iron supplements, take them with orange juice or a vitamin C supplement.  When you eat iron-fortified foods or take an iron supplement, you should also eat foods that naturally contain iron, such as meat, poultry, and fish. Eating naturally iron-rich foods helps your body to absorb the iron that is added to other foods or contained in a supplement.  Certain foods and drinks prevent your body from absorbing iron properly. Avoid eating these foods in the same meal as iron-rich foods or with iron supplements. These foods include: ? Coffee, black tea, and red wine. ? Milk, dairy products, and foods that are high in calcium. ? Beans and soybeans. ? Whole grains. What foods should I eat? Fruits Prunes. Raisins. Eat fruits high in vitamin C, such as oranges, grapefruits, and strawberries, alongside iron-rich foods. Vegetables Spinach (cooked). Green peas. Broccoli. Fermented vegetables. Eat vegetables high in vitamin C, such as leafy greens, potatoes, bell peppers, and tomatoes, alongside iron-rich foods. Grains Iron-fortified breakfast cereal. Iron-fortified whole-wheat bread. Enriched rice. Sprouted grains. Meats and other proteins Beef liver. Oysters. Beef. Shrimp. [place]. Chicken. Tuna. Sardines. Chickpeas. Nuts. Tofu. Pumpkin seeds. Beverages Tomato juice. Fresh orange juice. Prune juice. Hibiscus tea. Fortified instant breakfast shakes. Sweets and desserts Blackstrap molasses. Seasonings and condiments Tahini. Fermented soy sauce. Other foods Wheat germ. The items listed above may not be a complete list of recommended foods  and beverages. Contact a dietitian for more information. What foods should I avoid? Grains Whole grains. Bran cereal. Bran flour. Oats. Meats and other proteins Soybeans. Products made from soy protein. Black beans. Lentils. Mung beans. Split peas. Dairy Milk. Cream. Cheese. Yogurt. Cottage cheese. Beverages Coffee. Black tea. Red wine. Sweets and desserts Cocoa. Chocolate. Ice cream. Other foods Basil. Oregano. Large amounts of parsley. The items listed above may not be a complete list of foods and beverages to avoid. Contact a dietitian for more information. Summary  Iron is a mineral that helps your body to produce hemoglobin. Hemoglobin is a protein in red blood cells that carries oxygen to your body's tissues.  Iron is naturally found in many foods, and many foods have iron added to them (iron-fortified foods).  When you eat foods that contain iron, you should eat them with foods that are high in vitamin C. Vitamin C helps your body to absorb iron.  Certain foods and drinks prevent your body from absorbing iron properly, such as whole grains and dairy products. You should avoid eating these foods in the same meal as iron-rich foods or with iron supplements. This information is not intended to replace advice given to you by your health care provider. Make sure you discuss any questions you have with your health care provider. Document Revised: 02/22/2017 Document Reviewed: 02/05/2017 Elsevier Patient Education  2020 [employer].

## 2019-04-12 NOTE — Progress Notes (Signed)
D/C to home. V/O of all D/C instructions, teaching and F/U appointment.

## 2019-04-13 ENCOUNTER — Inpatient Hospital Stay: Admit: 2019-04-13 | Payer: Self-pay

## 2019-04-14 LAB — SURGICAL PATHOLOGY

## 2019-08-29 IMAGING — US US OB COMP LESS 14 WK
1 series · 14 of 28 positions shown · non-contrast
Comparison: None.

CLINICAL DATA: Pregnant patient.  Vaginal bleeding.

EXAM:
OBSTETRIC <14 WK US AND TRANSVAGINAL OB US
TECHNIQUE: Both transabdominal and transvaginal ultrasound examinations were
performed for complete evaluation of the gestation as well as the
maternal uterus, adnexal regions, and pelvic cul-de-sac.
Transvaginal technique was performed to assess early pregnancy.

[Series 1: us ob comp less 14 wk · 0.23mm/px · 14 of 99 slices shown]
[im 4/99]
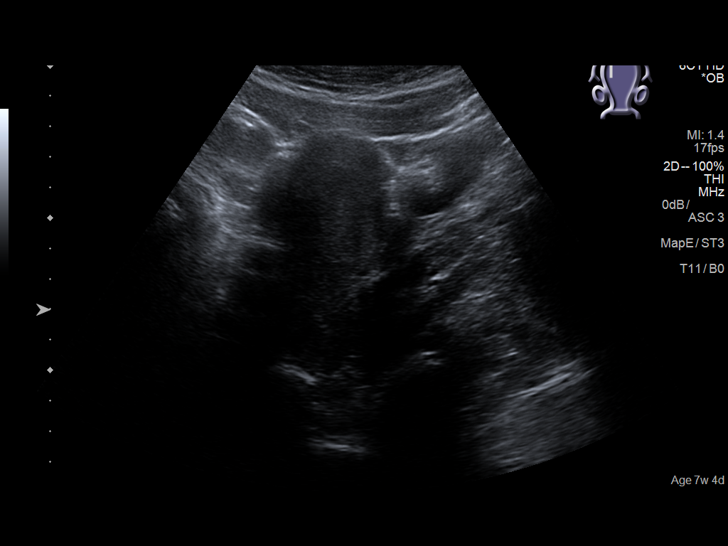
[im 11/99]
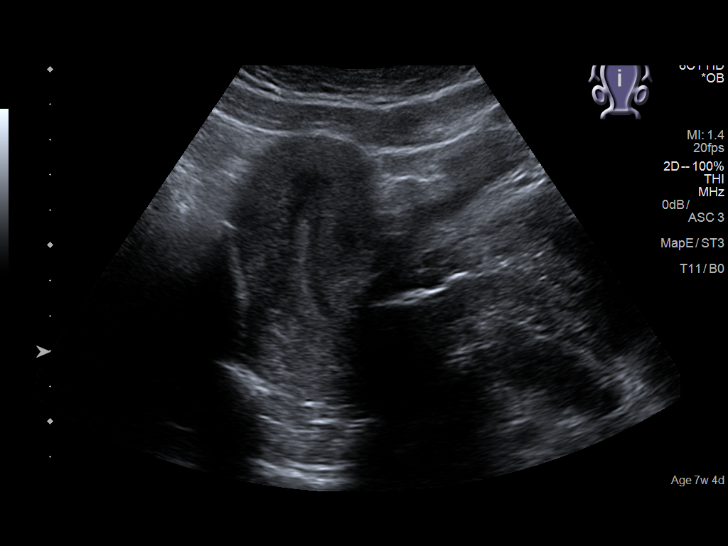
[im 19/99]
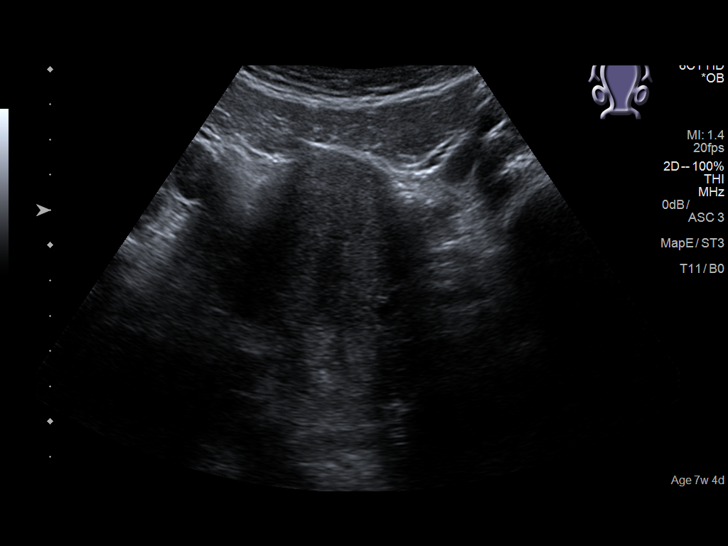
[im 26/99]
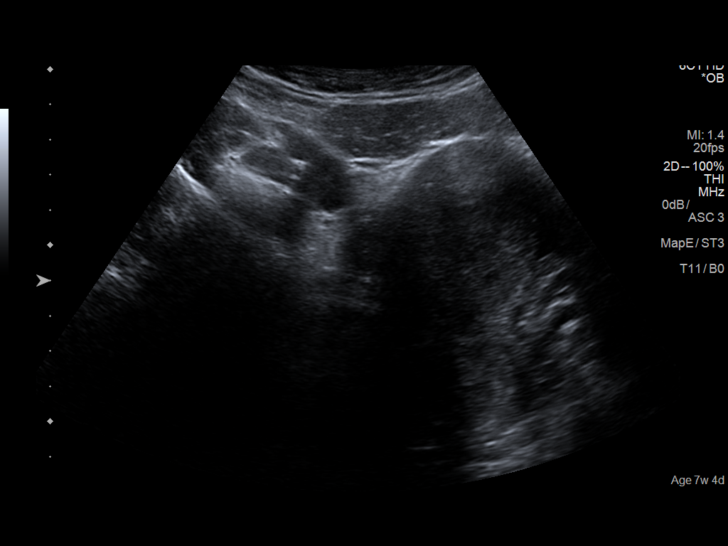
[im 33/99]
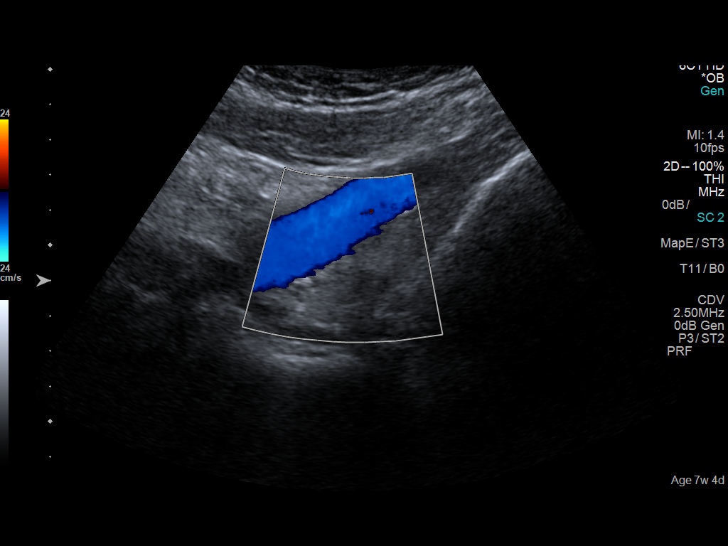
[im 40/99]
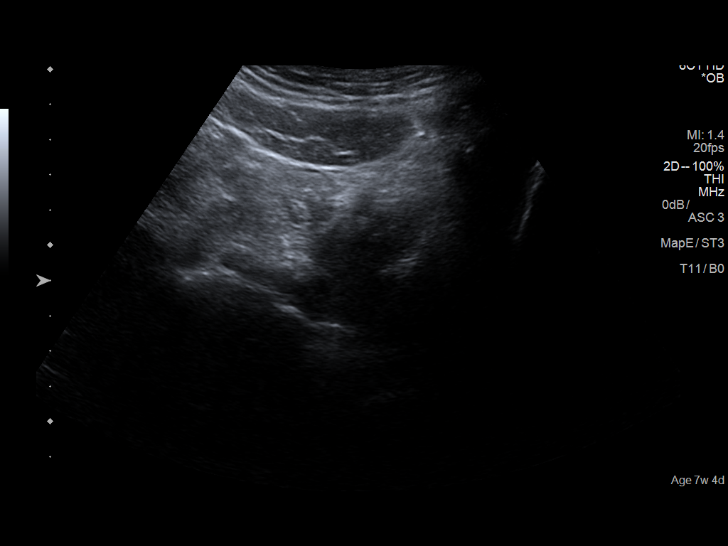
[im 48/99]
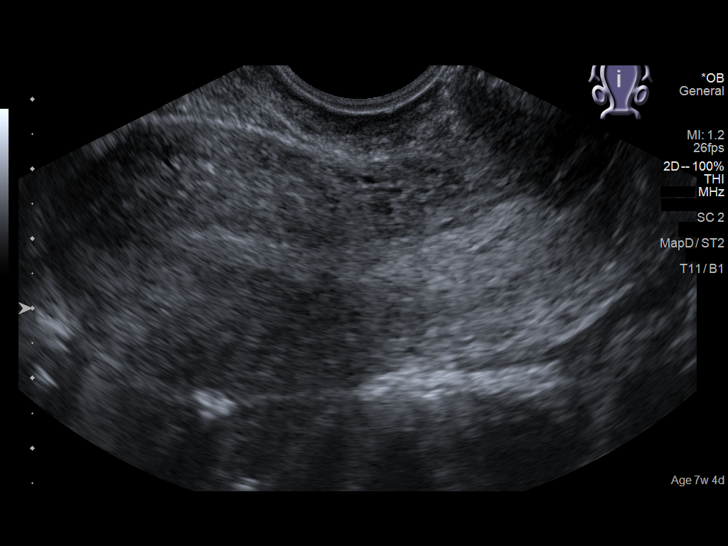
[im 55/99]
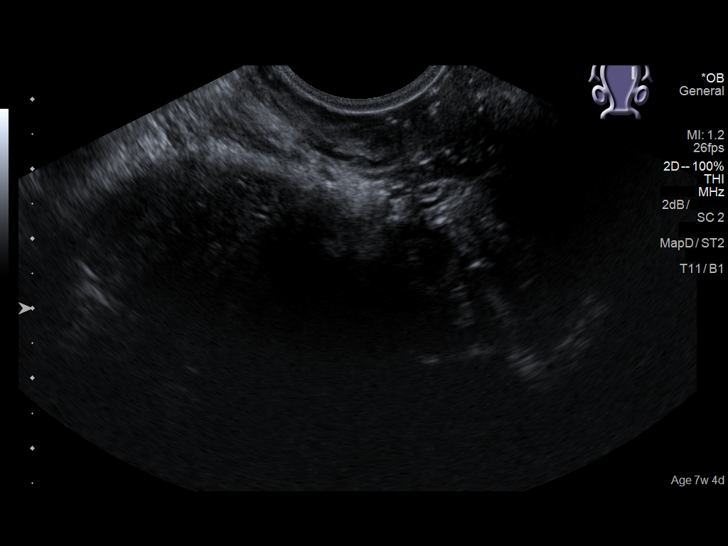
[im 62/99]
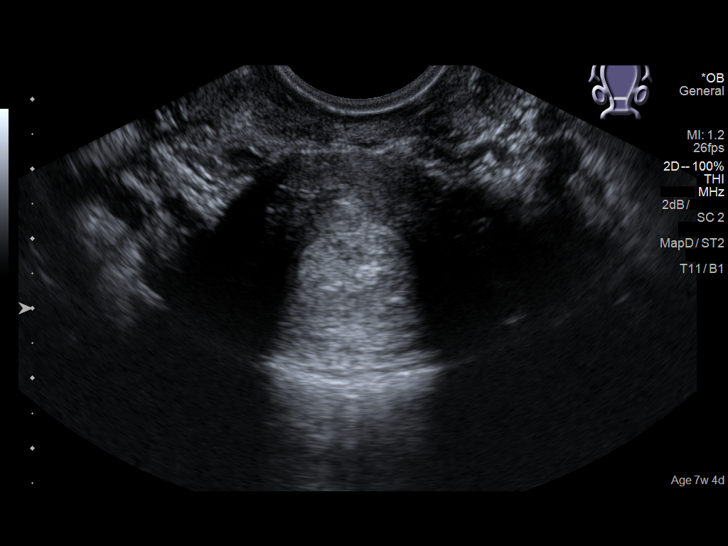
[im 69/99]
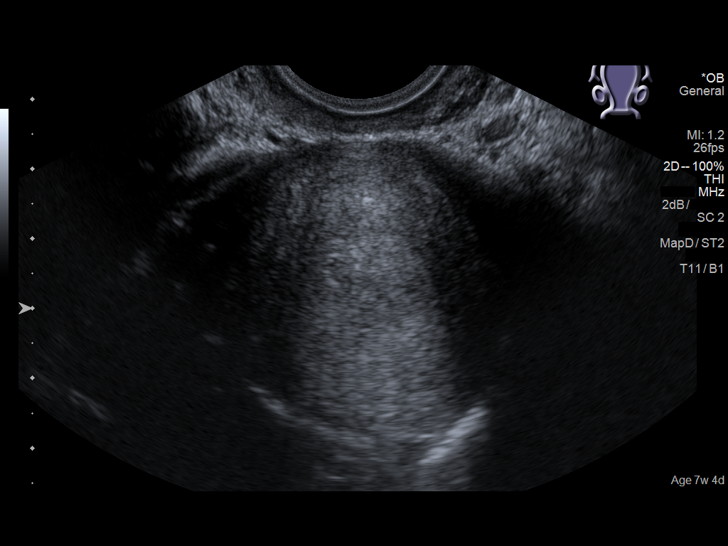
[im 77/99]
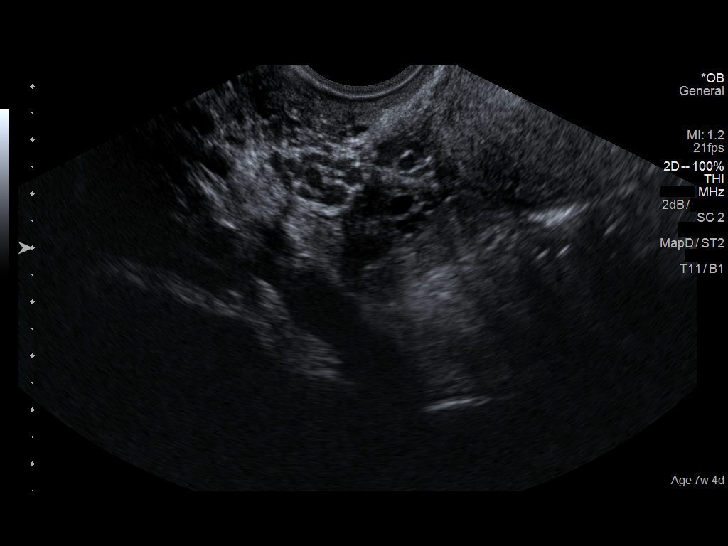
[im 84/99]
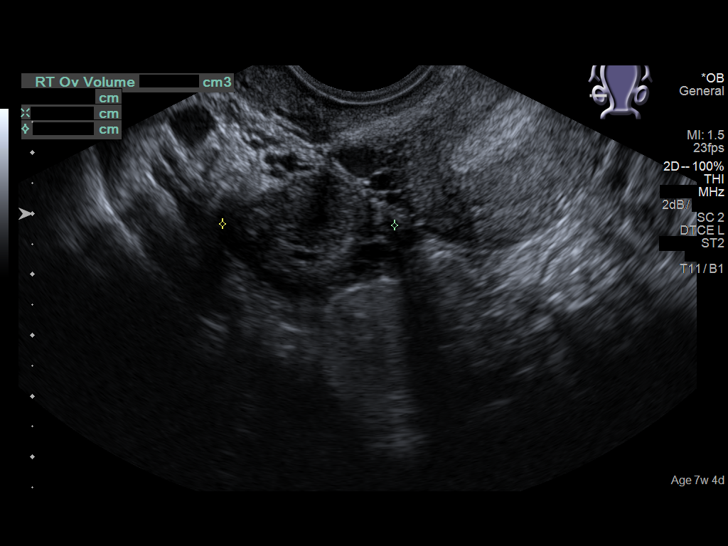
[im 91/99]
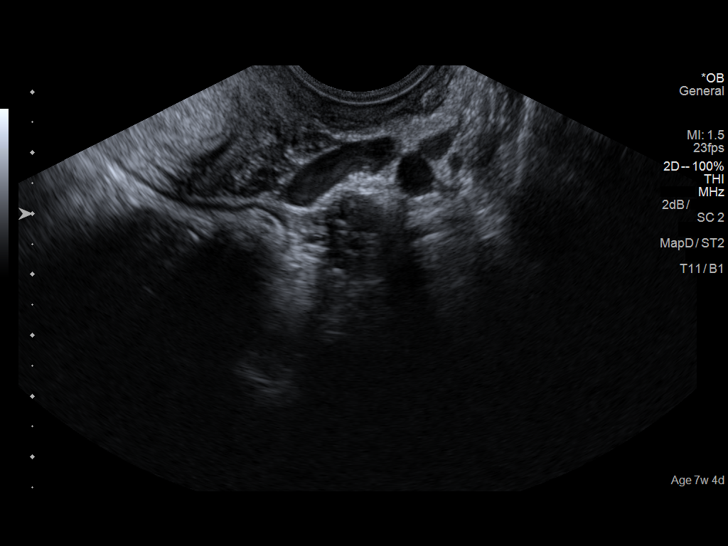
[im 99/99]
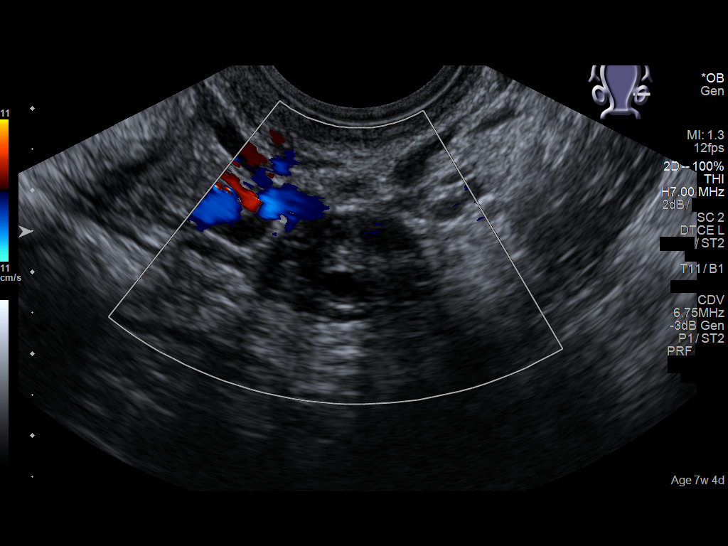

[14 of 28 positions shown; findings below may reference images not displayed]

FINDINGS: Intrauterine gestational sac: None

Maternal uterus/adnexae: The ovaries are normal in appearance.
IMPRESSION: 1. No intrauterine pregnancy identified. The lack of an IUP in the
setting of a positive pregnancy test could represent ectopic
pregnancy, early pregnancy, or recent miscarriage. Recommend close
follow-up.

## 2019-12-10 IMAGING — US LIMITED OBSTETRIC ULTRASOUND
2 series · 14 of 28 positions shown · non-contrast
Comparison: Prior gestation obstetrical ultrasound 05/04/2017

CLINICAL DATA: Pregnancy, syncope

EXAM:
LIMITED OBSTETRIC ULTRASOUND

[Series 1: limited obstetric ultrasound · 34 acquisitions, 13 frames shown (1 of 2)]
[im 2/34]
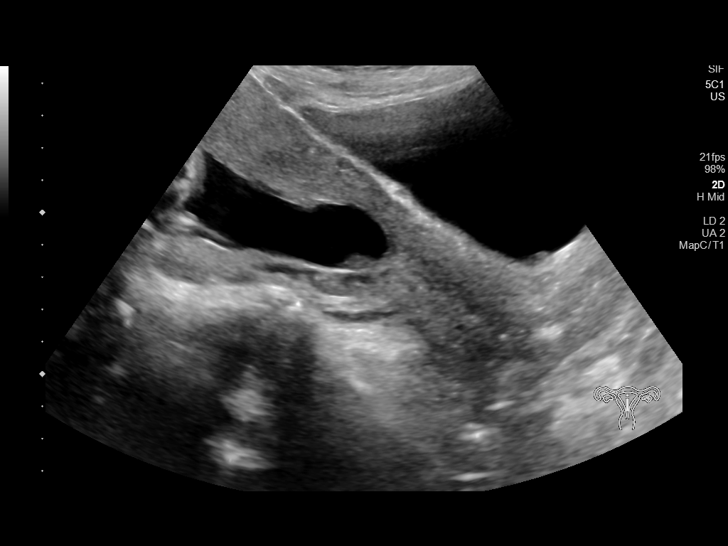
[im 4/34]
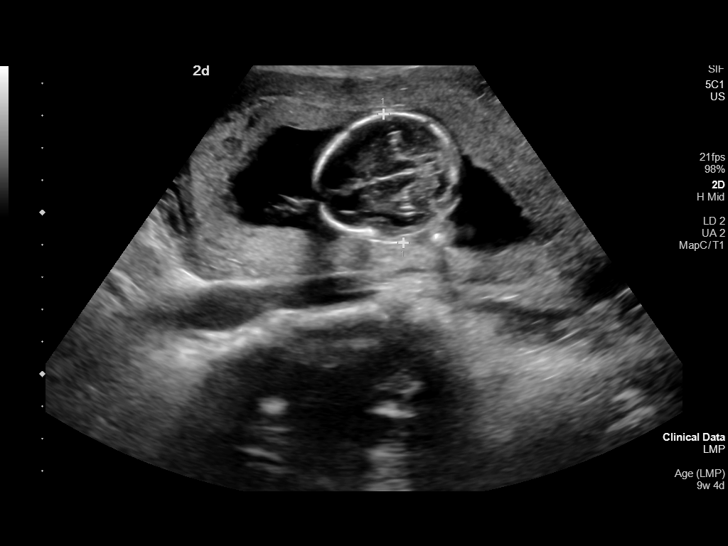
[im 7/34]
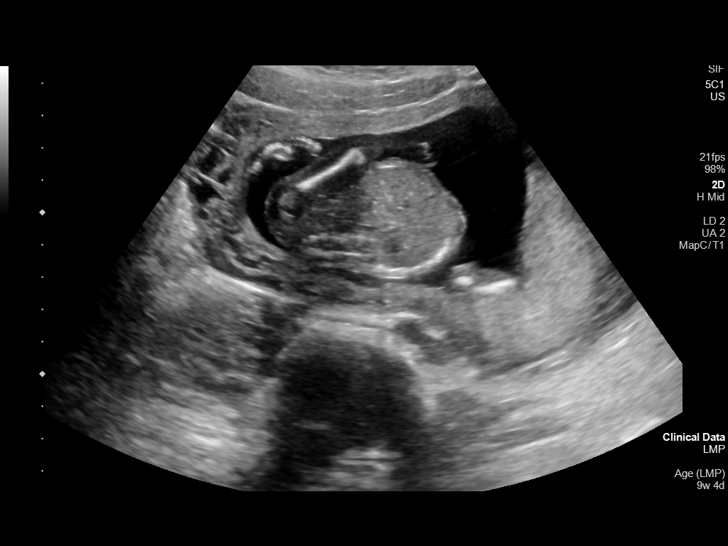
[im 9/34]
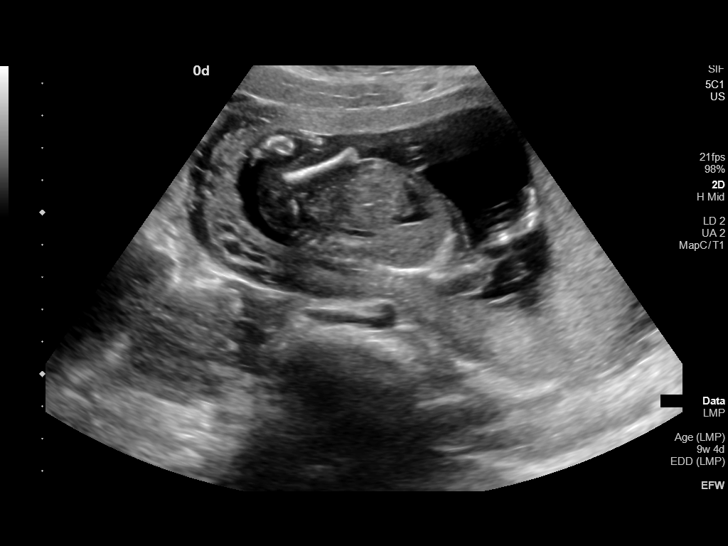
[im 12/34]
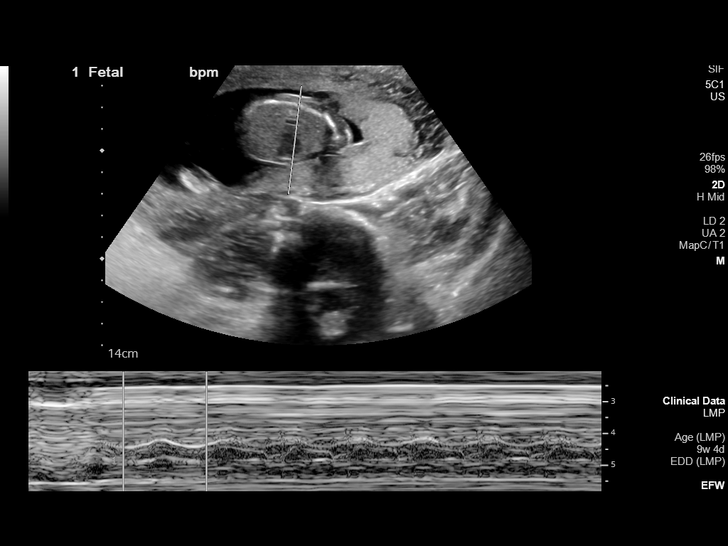
[im 14/34]
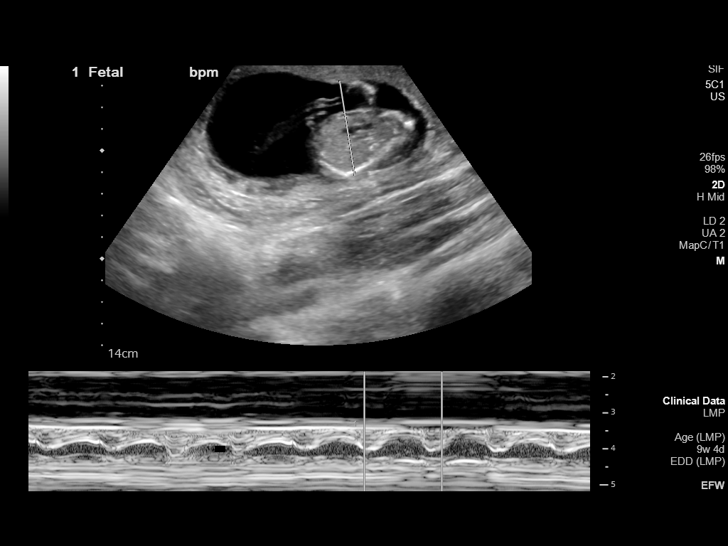
[im 17/34]
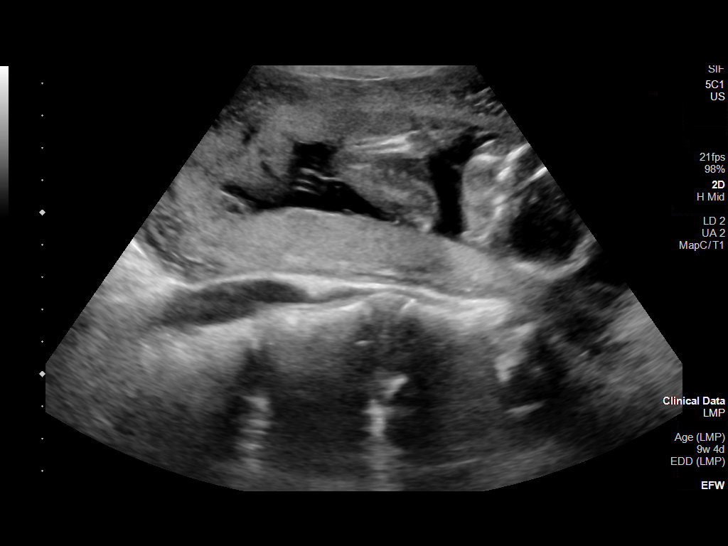
[im 20/34]
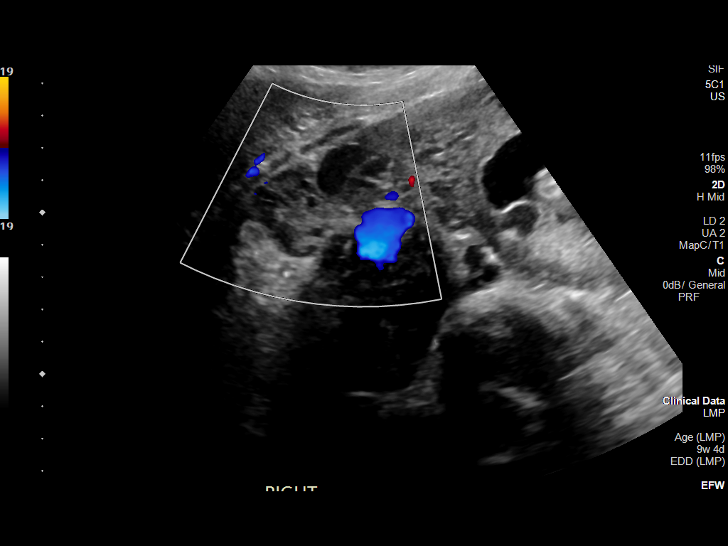
[im 22/34]
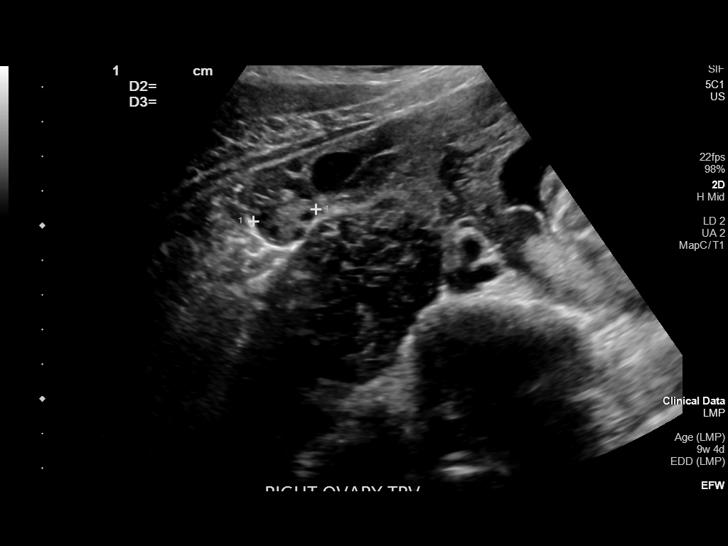
[im 25/34]
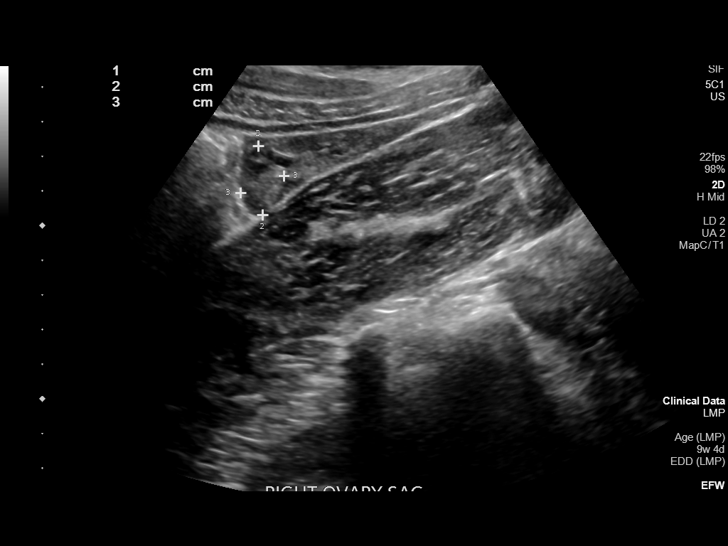
[im 27/34]
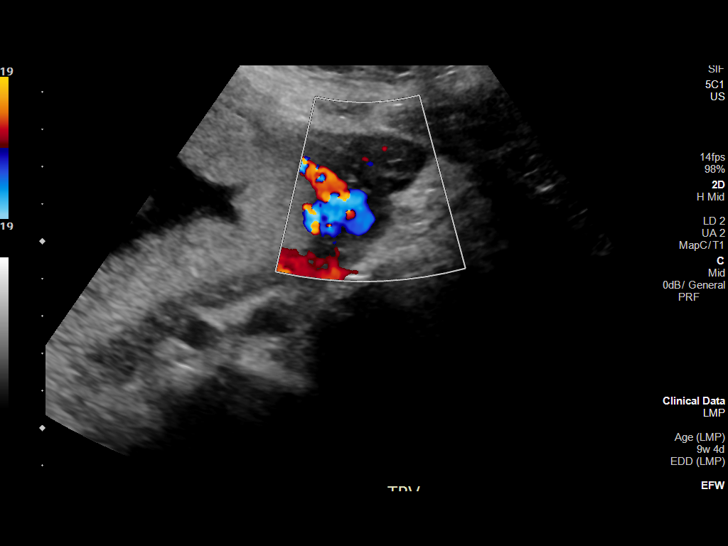
[im 30/34]
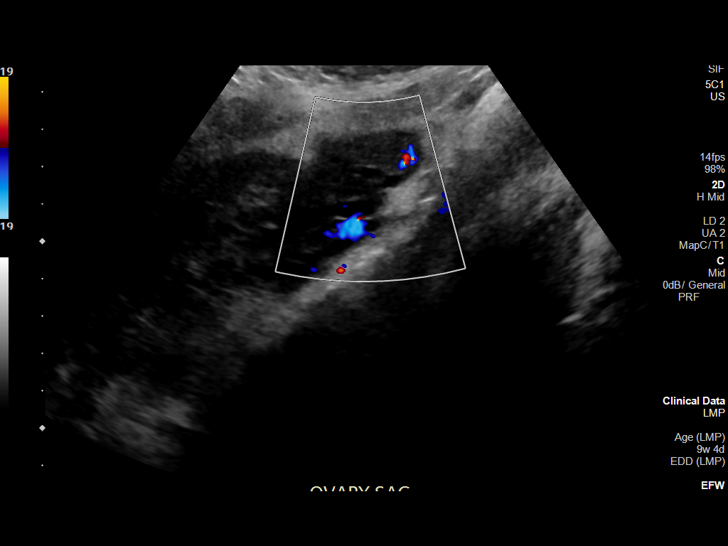
[im 32/34]
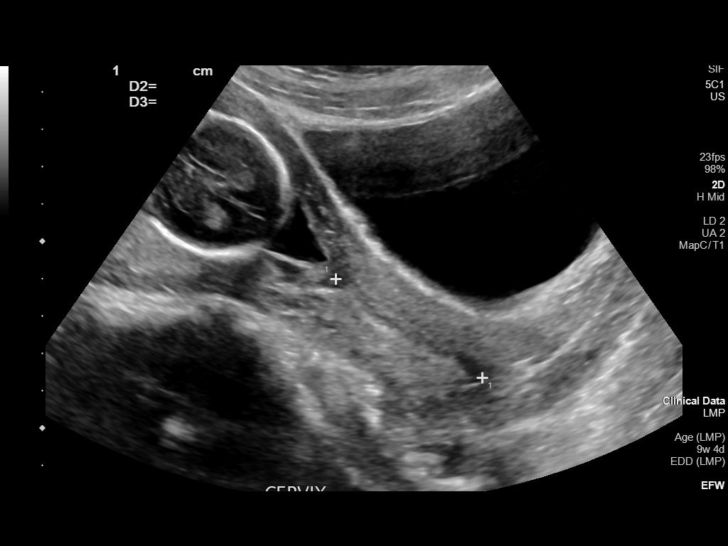

[Series 3: limited obstetric ultrasound · 1 of 1 slices shown (2 of 2)]
[im 1/1]
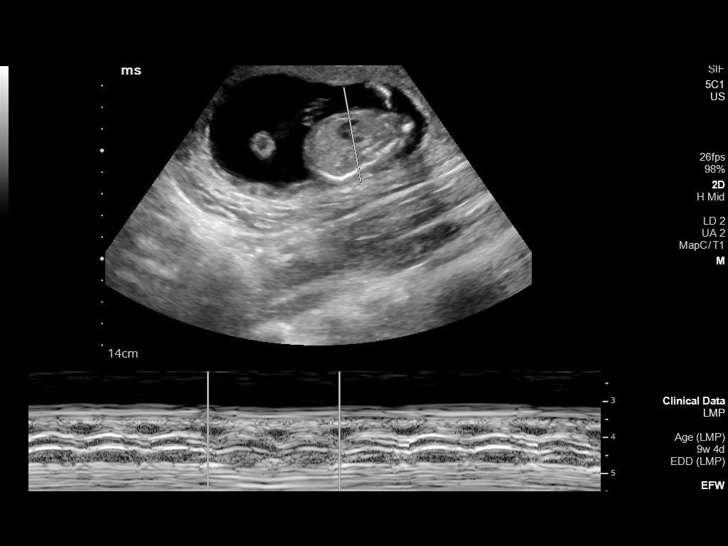

[14 of 28 positions shown; findings below may reference images not displayed]

FINDINGS: Number of Fetuses: 1

Heart Rate:  156 bpm

Movement: Yes

Presentation: Cephalic

Previa: No

Placental Location: Posterior

Amniotic Fluid (Subjective): Normal

BPD:  4cm 18w 2d

Maternal Findings:

Cervix:  Closed appearance of the cervix, length 4.7 cm.

Uterus/Adnexae: No abnormality visualized.

Intrauterine gestational sac: Single
IMPRESSION: Single viable intrauterine gestation, estimated gestational age by
BPD measurement 18 weeks, 2 days

Normal fetal heart rate.

## 2023-10-11 ENCOUNTER — Encounter: Payer: Self-pay | Admitting: Advanced Practice Midwife
